# Patient Record
Sex: Male | Born: 1940 | Race: White | Hispanic: No | Marital: Married | Smoking: Former smoker
Health system: Southern US, Community
[De-identification: ages and names within clinical notes are randomized; demographics above are authoritative.]

## PROBLEM LIST (undated history)

## (undated) DIAGNOSIS — I2510 Atherosclerotic heart disease of native coronary artery without angina pectoris: Secondary | ICD-10-CM

## (undated) DIAGNOSIS — G4730 Sleep apnea, unspecified: Secondary | ICD-10-CM

## (undated) DIAGNOSIS — E785 Hyperlipidemia, unspecified: Secondary | ICD-10-CM

## (undated) DIAGNOSIS — N289 Disorder of kidney and ureter, unspecified: Secondary | ICD-10-CM

## (undated) DIAGNOSIS — E119 Type 2 diabetes mellitus without complications: Secondary | ICD-10-CM

## (undated) HISTORY — PX: NOSE SURGERY: SHX723

## (undated) HISTORY — PX: CHOLECYSTECTOMY: SHX55

## (undated) HISTORY — PX: CARDIAC SURGERY: SHX584

---

## 2001-03-15 ENCOUNTER — Ambulatory Visit (HOSPITAL_BASED_OUTPATIENT_CLINIC_OR_DEPARTMENT_OTHER): Admission: RE | Admit: 2001-03-15 | Discharge: 2001-03-15 | Payer: Self-pay | Admitting: *Deleted

## 2005-03-15 ENCOUNTER — Inpatient Hospital Stay: Payer: Self-pay

## 2005-03-15 ENCOUNTER — Other Ambulatory Visit: Payer: Self-pay

## 2006-01-02 ENCOUNTER — Encounter: Admission: RE | Admit: 2006-01-02 | Discharge: 2006-01-03 | Payer: Self-pay | Admitting: Internal Medicine

## 2006-03-28 ENCOUNTER — Encounter: Admission: RE | Admit: 2006-03-28 | Discharge: 2006-04-04 | Payer: Self-pay | Admitting: Internal Medicine

## 2007-08-23 HISTORY — PX: CORONARY ARTERY BYPASS GRAFT: SHX141

## 2008-06-25 ENCOUNTER — Encounter: Payer: Self-pay | Admitting: Internal Medicine

## 2008-07-22 ENCOUNTER — Encounter: Payer: Self-pay | Admitting: Internal Medicine

## 2008-08-22 ENCOUNTER — Encounter: Payer: Self-pay | Admitting: Internal Medicine

## 2011-04-13 ENCOUNTER — Encounter (HOSPITAL_COMMUNITY): Payer: Medicare Other

## 2011-04-13 ENCOUNTER — Other Ambulatory Visit: Payer: Self-pay | Admitting: Orthopedic Surgery

## 2011-04-13 ENCOUNTER — Other Ambulatory Visit (HOSPITAL_COMMUNITY): Payer: Self-pay | Admitting: Orthopedic Surgery

## 2011-04-13 ENCOUNTER — Ambulatory Visit (HOSPITAL_COMMUNITY)
Admission: RE | Admit: 2011-04-13 | Discharge: 2011-04-13 | Disposition: A | Discharge status: Home / Self Care | Payer: Medicare Other | Source: Ambulatory Visit | Attending: Orthopedic Surgery | Admitting: Orthopedic Surgery

## 2011-04-13 DIAGNOSIS — Z01818 Encounter for other preprocedural examination: Secondary | ICD-10-CM | POA: Insufficient documentation

## 2011-04-13 DIAGNOSIS — E119 Type 2 diabetes mellitus without complications: Secondary | ICD-10-CM | POA: Insufficient documentation

## 2011-04-13 DIAGNOSIS — Z951 Presence of aortocoronary bypass graft: Secondary | ICD-10-CM | POA: Insufficient documentation

## 2011-04-13 DIAGNOSIS — I517 Cardiomegaly: Secondary | ICD-10-CM | POA: Insufficient documentation

## 2011-04-13 DIAGNOSIS — Z01812 Encounter for preprocedural laboratory examination: Secondary | ICD-10-CM | POA: Insufficient documentation

## 2011-04-13 DIAGNOSIS — I700 Atherosclerosis of aorta: Secondary | ICD-10-CM | POA: Insufficient documentation

## 2011-04-13 LAB — COMPREHENSIVE METABOLIC PANEL
ALT: 26 U/L (ref 0–53)
Alkaline Phosphatase: 32 U/L — ABNORMAL LOW (ref 39–117)
BUN: 31 mg/dL — ABNORMAL HIGH (ref 6–23)
CO2: 28 mEq/L (ref 19–32)
Chloride: 100 mEq/L (ref 96–112)
GFR calc Af Amer: 34 mL/min — ABNORMAL LOW (ref >60)
GFR calc non Af Amer: 28 mL/min — ABNORMAL LOW (ref >60)
Glucose, Bld: 80 mg/dL (ref 70–99)
Potassium: 4.7 mEq/L (ref 3.5–5.1)
Sodium: 137 mEq/L (ref 135–145)
Total Bilirubin: 0.9 mg/dL (ref 0.3–1.2)
Total Protein: 8.1 g/dL (ref 6.0–8.3)

## 2011-04-13 LAB — CBC
HCT: 41.8 % (ref 39.0–52.0)
MCHC: 33 g/dL (ref 30.0–36.0)
MCV: 97 fL (ref 78.0–100.0)
Platelets: 168 10*3/uL (ref 150–400)
RDW: 13.2 % (ref 11.5–15.5)
WBC: 5.6 10*3/uL (ref 4.0–10.5)

## 2011-04-13 LAB — URINALYSIS, ROUTINE W REFLEX MICROSCOPIC
Bilirubin Urine: NEGATIVE
Hgb urine dipstick: NEGATIVE
Ketones, ur: NEGATIVE mg/dL
Nitrite: NEGATIVE
Protein, ur: NEGATIVE mg/dL
Specific Gravity, Urine: 1.012 (ref 1.005–1.030)
Urobilinogen, UA: 0.2 mg/dL (ref 0.0–1.0)

## 2011-04-13 LAB — SURGICAL PCR SCREEN: Staphylococcus aureus: NEGATIVE

## 2011-04-13 LAB — DIFFERENTIAL
Eosinophils Absolute: 0.2 10*3/uL (ref 0.0–0.7)
Eosinophils Relative: 3 % (ref 0–5)
Lymphs Abs: 1.6 10*3/uL (ref 0.7–4.0)
Monocytes Absolute: 0.9 10*3/uL (ref 0.1–1.0)

## 2011-04-13 LAB — PROTIME-INR: INR: 1.05 (ref 0.00–1.49)

## 2011-04-21 ENCOUNTER — Inpatient Hospital Stay (HOSPITAL_COMMUNITY)
Admission: RE | Admit: 2011-04-21 | Discharge: 2011-04-25 | DRG: 470 | Disposition: A | Discharge status: Home Health | Payer: Medicare Other | Source: Ambulatory Visit | Attending: Orthopedic Surgery | Admitting: Orthopedic Surgery

## 2011-04-21 DIAGNOSIS — Z01812 Encounter for preprocedural laboratory examination: Secondary | ICD-10-CM

## 2011-04-21 DIAGNOSIS — Z6841 Body Mass Index (BMI) 40.0 and over, adult: Secondary | ICD-10-CM

## 2011-04-21 DIAGNOSIS — G4730 Sleep apnea, unspecified: Secondary | ICD-10-CM | POA: Diagnosis present

## 2011-04-21 DIAGNOSIS — M1710 Unilateral primary osteoarthritis, unspecified knee: Principal | ICD-10-CM | POA: Diagnosis present

## 2011-04-21 DIAGNOSIS — E662 Morbid (severe) obesity with alveolar hypoventilation: Secondary | ICD-10-CM | POA: Diagnosis present

## 2011-04-21 DIAGNOSIS — I2510 Atherosclerotic heart disease of native coronary artery without angina pectoris: Secondary | ICD-10-CM | POA: Diagnosis present

## 2011-04-21 DIAGNOSIS — D62 Acute posthemorrhagic anemia: Secondary | ICD-10-CM | POA: Diagnosis not present

## 2011-04-21 DIAGNOSIS — N184 Chronic kidney disease, stage 4 (severe): Secondary | ICD-10-CM | POA: Diagnosis present

## 2011-04-21 DIAGNOSIS — I129 Hypertensive chronic kidney disease with stage 1 through stage 4 chronic kidney disease, or unspecified chronic kidney disease: Secondary | ICD-10-CM | POA: Diagnosis present

## 2011-04-21 DIAGNOSIS — E119 Type 2 diabetes mellitus without complications: Secondary | ICD-10-CM | POA: Diagnosis present

## 2011-04-21 DIAGNOSIS — M21169 Varus deformity, not elsewhere classified, unspecified knee: Secondary | ICD-10-CM | POA: Diagnosis present

## 2011-04-21 DIAGNOSIS — Z951 Presence of aortocoronary bypass graft: Secondary | ICD-10-CM

## 2011-04-21 LAB — GLUCOSE, CAPILLARY
Glucose-Capillary: 139 mg/dL — ABNORMAL HIGH (ref 70–99)
Glucose-Capillary: 114 mg/dL — ABNORMAL HIGH (ref 70–99)
Glucose-Capillary: 112 mg/dL — ABNORMAL HIGH (ref 70–99)
Glucose-Capillary: 110 mg/dL — ABNORMAL HIGH (ref 70–99)

## 2011-04-21 LAB — BASIC METABOLIC PANEL
Calcium: 11.3 mg/dL — ABNORMAL HIGH (ref 8.4–10.5)
GFR calc Af Amer: 30 mL/min — ABNORMAL LOW (ref >60)
GFR calc non Af Amer: 24 mL/min — ABNORMAL LOW (ref >60)
Potassium: 4.5 mEq/L (ref 3.5–5.1)
Sodium: 140 mEq/L (ref 135–145)

## 2011-04-21 LAB — TYPE AND SCREEN
ABO/RH(D): A NEG
Antibody Screen: NEGATIVE

## 2011-04-22 LAB — HEMOGLOBIN A1C
Hgb A1c MFr Bld: 6.3 % — ABNORMAL HIGH (ref <5.7)
Mean Plasma Glucose: 134 mg/dL — ABNORMAL HIGH (ref <117)

## 2011-04-22 LAB — BASIC METABOLIC PANEL
BUN: 30 mg/dL — ABNORMAL HIGH (ref 6–23)
GFR calc Af Amer: 34 mL/min — ABNORMAL LOW (ref >60)
GFR calc non Af Amer: 28 mL/min — ABNORMAL LOW (ref >60)
Potassium: 4.4 mEq/L (ref 3.5–5.1)
Sodium: 136 mEq/L (ref 135–145)

## 2011-04-22 LAB — CBC
MCHC: 33.2 g/dL (ref 30.0–36.0)
RDW: 13.3 % (ref 11.5–15.5)

## 2011-04-22 NOTE — Op Note (Signed)
Jonathan Roman, Jonathan Roman                ACCOUNT NO.:  [id number]  MEDICAL RECORD NO.:  [id number]  LOCATION:  [id number]                         FACILITY:  [facility]  PHYSICIAN:  John L. Rendall, M.D.  DATE OF BIRTH:  Apr 06, 1941  DATE OF PROCEDURE:  04/21/2011 DATE OF DISCHARGE:                              OPERATIVE REPORT   PREOPERATIVE DIAGNOSIS:  End-stage osteoarthritis right knee with deformity.  SURGICAL PROCEDURE:  Right LCS total knee arthroplasty with computer navigation assistance.  POSTOPERATIVE DIAGNOSIS:  End-stage osteoarthritis right knee with deformity.  SURGEON:  John L. Rendall, MD  ASSISTANT:  Vonita Moss. Duffy, PA-C  PATHOLOGY:  The patient has longstanding knee arthritis that has been quite bothersome for years.  He has pain with weightbearing pain, getting up and down, it has reached to the point where it has greatly diminished his quality of living.  Intermittent use of cane is required.  Pathology at surgery was fixed 10 degrees varus deformity with 7-degree flexion contracture, total bone against bone medial compartment of the knee.  Some areas of bare bone lateral compartment and under the patella with spurs in all compartments.  PROCEDURE IN DETAIL:  Under general anesthesia with femoral nerve block, the right leg was prepared with DuraPrep and draped as sterile field.  A sterile tourniquet was used on the proximal thigh.  Leg was wrapped out with the Esmarch and the tourniquet was used at 350 mm.  Midline incision was made.  The patella was everted and the debridement was done in preparation for computer mapping.  Schanz pins were then placed in the superior medial tibia and distal femur.  The arrays were set up, the femoral head was found, medial and lateral malleoli were identified. Mapping of the proximal tibia and distal femur were then done.  The femoral component size was estimated to be a large.  Using the first computer guide, the superior tibia was  resected.  Using the balancing clamps, the knee was then extended and brought to within 1 degree of anatomic alignment from 10 degrees.  This required takedown of medial tibial spurs and release of the deep medial collateral ligament, leaving the superficial intact distally.  Once this was done, the alignment was obtainable within 1 degree of anatomic.  Then using the first femoral guide, the anterior and posterior flare of the distal femur were resected.  It should be noted that there was some slight rotation to the femur that made a bony prominence of the lateral side of the femur, it was not true notching and this was smoothed.  The posterior femoral condyles were then trimmed, rotation was within 1 degree of anatomic. The distal femoral cut was then made, flexion and extension gaps were equal at approximately 12.5 mm.  The lamina spreader was inserted. Remnants of the cruciates and menisci were resected as well as spurs off the back of the femoral condyles.  Once this was debrided, the recessing guide was used.  Then, attention was turned to the tibia because of the gentleman's size, an M.B.T. revision tray was used as it would be expected to hold up longer to his weight as it is an inch longer.  The M.B.T. revision tray was then trialed with a 12.5 bearing and large femur, alignment was within 1 degree in longitudinal alignment and flexion allowed 110 degrees easily.  There was good stability to varus valgus and drawer.  At this point, permanent components were obtained. The LCS large femur, M.B.T. revision tibial tray, 12.5 bearing, and large patellar component.  While the cement mixed, bony surfaces were prepared with pulse irrigation.  While cement hardened and all components were in place, synovectomy was completed, tourniquet was let down at an hour and 10 minutes.  Multiple small vessels were cauterized. The knee was then closed in layers with #1 Tycron, #1 Vicryl, 2-0 Vicryl,  and skin clips.  A medium Hemovac drain was in the knee.  The patient tolerated the procedure well and returned to Recovery in good condition.     John L. Telford Nab, M.D.     Judie Grieve  D:  04/21/2011  T:  04/22/2011  Job:  [id number]  Electronically Signed by Oretha Caprice M.D. on 04/22/71 [id number] AM

## 2011-04-23 LAB — CBC
HCT: 30.1 % — ABNORMAL LOW (ref 39.0–52.0)
Hemoglobin: 10 g/dL — ABNORMAL LOW (ref 13.0–17.0)
MCV: 96.8 fL (ref 78.0–100.0)
RDW: 13.2 % (ref 11.5–15.5)
WBC: 9.6 10*3/uL (ref 4.0–10.5)

## 2011-04-23 LAB — BASIC METABOLIC PANEL
BUN: 27 mg/dL — ABNORMAL HIGH (ref 6–23)
Chloride: 101 mEq/L (ref 96–112)
Creatinine, Ser: 2.17 mg/dL — ABNORMAL HIGH (ref 0.50–1.35)
GFR calc Af Amer: 36 mL/min — ABNORMAL LOW (ref >60)
Glucose, Bld: 144 mg/dL — ABNORMAL HIGH (ref 70–99)

## 2011-04-23 LAB — GLUCOSE, CAPILLARY: Glucose-Capillary: 127 mg/dL — ABNORMAL HIGH (ref 70–99)

## 2011-04-24 LAB — CBC
HCT: 27.3 % — ABNORMAL LOW (ref 39.0–52.0)
Hemoglobin: 9.2 g/dL — ABNORMAL LOW (ref 13.0–17.0)
MCHC: 33.7 g/dL (ref 30.0–36.0)
RBC: 2.83 MIL/uL — ABNORMAL LOW (ref 4.22–5.81)
WBC: 10.4 10*3/uL (ref 4.0–10.5)

## 2011-04-24 LAB — GLUCOSE, CAPILLARY
Glucose-Capillary: 187 mg/dL — ABNORMAL HIGH (ref 70–99)
Glucose-Capillary: 141 mg/dL — ABNORMAL HIGH (ref 70–99)
Glucose-Capillary: 145 mg/dL — ABNORMAL HIGH (ref 70–99)

## 2011-04-25 LAB — GLUCOSE, CAPILLARY
Glucose-Capillary: 121 mg/dL — ABNORMAL HIGH (ref 70–99)
Glucose-Capillary: 119 mg/dL — ABNORMAL HIGH (ref 70–99)

## 2011-04-27 LAB — GLUCOSE, CAPILLARY: Glucose-Capillary: 149 mg/dL — ABNORMAL HIGH (ref 70–99)

## 2011-05-25 NOTE — Discharge Summary (Addendum)
Jonathan Roman, Jonathan Roman                ACCOUNT NO.:  [id number]  MEDICAL RECORD NO.:  [id number]  LOCATION:  [id number]                         FACILITY:  [facility]  PHYSICIAN:  Ila Landowski L. Rendall, M.D.  DATE OF BIRTH:  1940-11-29  DATE OF ADMISSION:  04/21/2011 DATE OF DISCHARGE:  04/25/2011                              DISCHARGE SUMMARY   ADMISSION DIAGNOSES: 1. End-stage osteoarthritis, right knee. 2. Morbid obesity. 3. Type 2 diabetes mellitus. 4. 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 5. Sleep apnea/obesity, hypoventilation syndrome. 6. Chronic renal insufficiency.  DISCHARGE DIAGNOSES: 1. End-stage osteoarthritis, right knee, status post right total knee     arthroplasty. 2. Acute blood loss anemia secondary to surgery. 3. Morbid obesity. 4. Type 2 diabetes mellitus. 5. 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 6. Sleep apnea/obesity, hypoventilation syndrome. 7. Chronic renal insufficiency/stage 3 or 4 kidney disease.  SURGICAL PROCEDURE:  On April 21, 2011, Jonathan Roman underwent a right total knee arthroplasty with computer navigation by Dr. Jenny Reichmann L. Rendall, assisted by Zenaida Deed, PA-C.  He had an LCS complete metal backed patella cemented, size large, placed with a primary femoral cemented component, large right.  A tibial tray rotating platform MBT revision size 5 cemented with an LCS complete rotating platform 12.5-mm thickness.  COMPLICATIONS:  None.  CONSULTS: 1. Respiratory therapy, consult April 21, 2011. 2. Physical therapy consult, April 22, 2011. 3. Occupational therapy and case management consult, April 23, 2011.  HISTORY OF PRESENT ILLNESS:  This 70 year old white male patient presented to Dr. Telford Nab with a 7-to-10-year history of sudden onset of progressive right knee pain without injury or prior surgery.  At this point, the right knee pain is constant, throbbing, over the anterior knee without radiation.  It increases with standing and  decreases with rest.  Percocet provides some relief, but the knee pops and swells.  He has had cortisone injections and anti-inflammatories and viscosupplementation with minimal relief.  He has a significant limp and does not walk with any assisted devices.  He has failed conservative treatment.  Because of that, he is presenting for a right knee replacement.  HOSPITAL COURSE:  Jonathan Roman tolerated his surgical procedure well without immediate postoperative complications.  He is transferred to the orthopedic floor.  Postop day #1, T-max was 99.2.  Vitals were stable. Hemoglobin 10.5, hematocrit 31.6.  He was switched from the PCA to the Percocet for pain, weaned off his oxygen and started on therapy per protocol.  He made good progress over the next several days.  It was slow at times. His hemoglobin dropped to 9.2 and 27.3 on April 24, 2011.  He had some constipation, which was treated effectively with laxative.  It was felt on April 25, 2011, he remained afebrile.  The vital signs stable.  He was doing well enough with therapy.  That facility was ready for discharge home and he was discharged home later that day.  DISCHARGE INSTRUCTIONS:  DIET:  He can resume his regular prehospitalization diet.  MEDICATIONS:  Please see the patient med discharge instruction sheet for complete documentation of medications, but we did place him on 3 new meds Robaxin,  Percocet and Xarelto.  We had him stopped his aspirin at this time until he has completed his Xarelto and stopped hydrocodone. The rest the medications are on the med discharge instruction sheet.  ACTIVITY:  He can be out of bed weightbearing as tolerated on the right leg with use of a walker.  No lifting or driving for 6 weeks.  Please see the right total joint discharge sheet for further activity instructions.  WOUND CARE:  Please see the right total joint discharge sheet for further wound care instructions.  FOLLOWUP:  He  is to follow up with Dr. Telford Nab in our office on Tuesday, May 03, 2011, and needs to call [phone] for that appointment.  He is arranged for home health per [facility].  LABORATORY DATA:  He did have a tick removed from his leg day after surgery.  It was a lone star tick.  Hemoglobin/hematocrit ranged from 10.5 and 31.6 from April 22, 2011, to 9.2 and 27.3 on the April 24, 2011.  Platelets and white count were within normal limits.  Glucose ranged from 137 on the April 21, 2011, to 147 on the April 22, 2011.  BUN and creatinine went from 41 and 2.6 on the August 30, to 27 and 2.17 on April 23, 2011.  Estimated GFR was 24 to 30, on the April 21, 2011, and on April 23, 2011.  Calcium went from 11.3 on the April 21, 2011, to 9.5 on the April 23, 2011.  His hemoglobin A1c on April 22, 2011, was 6.3%. All other laboratory studies were within normal limits.    Vonita Moss Duffy, P.A.   ______________________________ Karen Chafe. Telford Nab, M.D.   KED/MEDQ  D:  05/12/2011  T:  05/12/2011  Job:  [id number]  Electronically Signed by Jenne Campus. on 05/19/2011 09:39:06 AM Electronically Signed by Oretha Caprice M.D. on 05/25/2011 02:03:41 PM Electronically Signed by Oretha Caprice M.D. on 05/25/2011 02:03:39 PM

## 2016-03-13 ENCOUNTER — Emergency Department (HOSPITAL_COMMUNITY)
Admission: EM | Admit: 2016-03-13 | Discharge: 2016-03-13 | Disposition: A | Discharge status: Home / Self Care | Payer: Medicare Other | Attending: Emergency Medicine | Admitting: Emergency Medicine

## 2016-03-13 ENCOUNTER — Encounter (HOSPITAL_COMMUNITY): Payer: Self-pay | Admitting: Emergency Medicine

## 2016-03-13 DIAGNOSIS — Z79899 Other long term (current) drug therapy: Secondary | ICD-10-CM | POA: Diagnosis not present

## 2016-03-13 DIAGNOSIS — Z794 Long term (current) use of insulin: Secondary | ICD-10-CM | POA: Insufficient documentation

## 2016-03-13 DIAGNOSIS — M10062 Idiopathic gout, left knee: Secondary | ICD-10-CM | POA: Insufficient documentation

## 2016-03-13 DIAGNOSIS — M10072 Idiopathic gout, left ankle and foot: Secondary | ICD-10-CM | POA: Diagnosis not present

## 2016-03-13 DIAGNOSIS — M109 Gout, unspecified: Secondary | ICD-10-CM

## 2016-03-13 DIAGNOSIS — E119 Type 2 diabetes mellitus without complications: Secondary | ICD-10-CM | POA: Diagnosis not present

## 2016-03-13 DIAGNOSIS — Z7982 Long term (current) use of aspirin: Secondary | ICD-10-CM | POA: Diagnosis not present

## 2016-03-13 DIAGNOSIS — M25562 Pain in left knee: Secondary | ICD-10-CM | POA: Diagnosis present

## 2016-03-13 HISTORY — DX: Type 2 diabetes mellitus without complications: E11.9

## 2016-03-13 MED ORDER — OXYCODONE-ACETAMINOPHEN 5-325 MG PO TABS
2.0000 | ORAL_TABLET | Freq: Once | ORAL | Status: AC
  Administered 2016-03-13: 2 via ORAL
  Filled 2016-03-13: qty 2

## 2016-03-13 MED ORDER — COLCHICINE 0.6 MG PO TABS
1.2000 mg | ORAL_TABLET | Freq: Once | ORAL | Status: AC
  Administered 2016-03-13: 1.2 mg via ORAL
  Filled 2016-03-13: qty 2

## 2016-03-13 MED ORDER — OXYCODONE-ACETAMINOPHEN 5-325 MG PO TABS
1.0000 | ORAL_TABLET | ORAL | 0 refills | Status: AC | PRN
Start: 2016-03-13 — End: ?

## 2016-03-13 MED ORDER — IBUPROFEN 200 MG PO TABS
600.0000 mg | ORAL_TABLET | Freq: Once | ORAL | Status: AC
  Administered 2016-03-13: 600 mg via ORAL
  Filled 2016-03-13: qty 3

## 2016-03-13 MED ORDER — COLCHICINE 0.6 MG PO TABS: 0.6000 mg | ORAL_TABLET | Freq: Two times a day (BID) | ORAL | 0 refills | Status: AC

## 2016-03-13 NOTE — ED Triage Notes (Addendum)
P/t comes in with c/o of pain 9/10 and inability to walk due to L. Knee and Foot pain. Per p/t this is the second occurrence last occurring 1.5 years ago with gout diagnosis.

## 2016-03-13 NOTE — ED Provider Notes (Signed)
[place] DEPT Provider Note   CSN: [id number] Arrival date & time: 03/13/16  [id number]  First Provider Contact:  None       History   Chief Complaint Chief Complaint  Patient presents with  . Knee Pain    Left  . Foot Pain    Left    HPI Jonathan Roman is a 75 y.o. male. Patient presents to the emergency department with complaints of increasing left ankle and left knee pain over the past several days.  He has a history of gout and reports this feels similar.  His pain is severe in severity and worse with range of motion of his left knee and left ankle.  No fevers or chills at home.  Denies nausea vomiting or diarrhea.  He had some leftover oxycodone which he took which gave him some mild improvement.      Past Medical History:  Diagnosis Date  . Diabetes mellitus without complication ([place])     There are no active problems to display for this patient.   Past Surgical History:  Procedure Laterality Date  . CARDIAC SURGERY         Home Medications    Prior to Admission medications   Medication Sig Start Date End Date Taking? Authorizing Provider  allopurinol (ZYLOPRIM) 100 MG tablet Take 100 mg by mouth daily.   Yes Historical Provider, MD  amoxicillin (AMOXIL) 500 MG capsule Take 500 mg by mouth 2 (two) times daily. 03/08/16  Yes Historical Provider, MD  aspirin EC 81 MG tablet Take 81 mg by mouth daily.   Yes Historical Provider, MD  Cholecalciferol (VITAMIN D-3) 1000 units CAPS Take 1,000 Units by mouth daily.   Yes Historical Provider, MD  fenofibrate (TRICOR) 48 MG tablet Take 48 mg by mouth daily.   Yes Historical Provider, MD  insulin NPH Human (HUMULIN N,NOVOLIN N) 100 UNIT/ML injection Inject 20 Units into the skin at bedtime.   Yes Historical Provider, MD  insulin regular (NOVOLIN R,HUMULIN R) 100 units/mL injection Inject 14 Units into the skin 3 (three) times daily before meals.   Yes Historical Provider, MD  lisinopril (PRINIVIL,ZESTRIL) 20 MG tablet  Take 20 mg by mouth daily.   Yes Historical Provider, MD  metoprolol tartrate (LOPRESSOR) 25 MG tablet Take 12.5 mg by mouth 2 (two) times daily.   Yes Historical Provider, MD  niacin 500 MG tablet Take 500 mg by mouth 2 (two) times daily.   Yes Historical Provider, MD  nortriptyline (PAMELOR) 10 MG capsule Take 10 mg by mouth at bedtime.   Yes Historical Provider, MD  Omega-3 Fatty Acids (FISH OIL) 1000 MG CPDR Take 1,000 mg by mouth daily.   Yes Historical Provider, MD  simvastatin (ZOCOR) 40 MG tablet Take 40 mg by mouth daily.   Yes Historical Provider, MD    Family History No family history on file.  Social History Social History  Substance Use Topics  . Smoking status: Former Smoker    Packs/day: 1.00    Types: Cigarettes    Quit date: 30  . Smokeless tobacco: Never Used  . Alcohol use No     Allergies   Statins   Review of Systems Review of Systems  All other systems reviewed and are negative.    Physical Exam Updated Vital Signs BP 123/73 (BP Location: Right Arm)   Pulse 96   Temp 98.3 F (36.8 C) (Oral)   Resp 18   SpO2 96%   Physical Exam  Constitutional:  He is oriented to person, place, and time. He appears well-developed and well-nourished.  HENT:  Head: Normocephalic.  Eyes: EOM are normal.  Neck: Normal range of motion.  Pulmonary/Chest: Effort normal.  Abdominal: He exhibits no distension.  Musculoskeletal:  Warmth and swelling of both the left knee and left ankle.  Both are painful to range of motion.  Normal pulses in left foot.  No swelling of the left lower extremity as compared to right grossly.  Neurological: He is alert and oriented to person, place, and time.  Psychiatric: He has a normal mood and affect.  Nursing note and vitals reviewed.    ED Treatments / Results  Labs (all labs ordered are listed, but only abnormal results are displayed) Labs Reviewed - No data to display  EKG  EKG Interpretation None        Radiology No results found.  Procedures Procedures (including critical care time)  Medications Ordered in ED Medications  oxyCODONE-acetaminophen (PERCOCET/ROXICET) 5-325 MG per tablet 2 tablet (2 tablets Oral Given 03/13/16 1119)  ibuprofen (ADVIL,MOTRIN) tablet 600 mg (600 mg Oral Given 03/13/16 1121)  colchicine tablet 1.2 mg (1.2 mg Oral Given 03/13/16 1122)     Initial Impression / Assessment and Plan / ED Course  I have reviewed the triage vital signs and the nursing notes.  Pertinent labs & imaging results that were available during my care of the patient were reviewed by me and considered in my medical decision making (see chart for details).  Clinical Course  Comment By Time  Will tx pain [name], MD 07/23 1042    1:36 PM Patient feels better this time.  His examination and history are consistent with acute gouty flare of both his left knee and left ankle.  No injury or trauma.  Pain treated.  Patient be discharged home in good condition with primary care follow-up.  I will discharge the patient home with Percocet and colchicine.  He understands to return to the ER for new or worsening symptoms  Final Clinical Impressions(s) / ED Diagnoses   Final diagnoses:  Acute gout of left ankle, unspecified cause  Acute gout of left knee, unspecified cause    New Prescriptions New Prescriptions   COLCHICINE 0.6 MG TABLET    Take 1 tablet (0.6 mg total) by mouth 2 (two) times daily.   OXYCODONE-ACETAMINOPHEN (PERCOCET/ROXICET) 5-325 MG TABLET    Take 1 tablet by mouth every 4 (four) hours as needed for severe pain.     [name], MD 03/13/16 [phone]

## 2017-02-04 ENCOUNTER — Encounter: Payer: Self-pay | Admitting: Emergency Medicine

## 2017-02-04 ENCOUNTER — Emergency Department
Admission: EM | Admit: 2017-02-04 | Discharge: 2017-02-04 | Disposition: A | Discharge status: Home / Self Care | Payer: Medicare Other | Attending: Emergency Medicine | Admitting: Emergency Medicine

## 2017-02-04 ENCOUNTER — Emergency Department: Payer: Medicare Other

## 2017-02-04 DIAGNOSIS — Y999 Unspecified external cause status: Secondary | ICD-10-CM | POA: Insufficient documentation

## 2017-02-04 DIAGNOSIS — Z794 Long term (current) use of insulin: Secondary | ICD-10-CM | POA: Insufficient documentation

## 2017-02-04 DIAGNOSIS — Z79899 Other long term (current) drug therapy: Secondary | ICD-10-CM | POA: Insufficient documentation

## 2017-02-04 DIAGNOSIS — Y929 Unspecified place or not applicable: Secondary | ICD-10-CM | POA: Diagnosis not present

## 2017-02-04 DIAGNOSIS — I2510 Atherosclerotic heart disease of native coronary artery without angina pectoris: Secondary | ICD-10-CM | POA: Insufficient documentation

## 2017-02-04 DIAGNOSIS — S61412A Laceration without foreign body of left hand, initial encounter: Secondary | ICD-10-CM | POA: Insufficient documentation

## 2017-02-04 DIAGNOSIS — S60922A Unspecified superficial injury of left hand, initial encounter: Secondary | ICD-10-CM | POA: Diagnosis present

## 2017-02-04 DIAGNOSIS — Z23 Encounter for immunization: Secondary | ICD-10-CM | POA: Diagnosis not present

## 2017-02-04 DIAGNOSIS — E119 Type 2 diabetes mellitus without complications: Secondary | ICD-10-CM | POA: Diagnosis not present

## 2017-02-04 DIAGNOSIS — Y939 Activity, unspecified: Secondary | ICD-10-CM | POA: Diagnosis not present

## 2017-02-04 DIAGNOSIS — Z87891 Personal history of nicotine dependence: Secondary | ICD-10-CM | POA: Diagnosis not present

## 2017-02-04 DIAGNOSIS — W293XXA Contact with powered garden and outdoor hand tools and machinery, initial encounter: Secondary | ICD-10-CM | POA: Insufficient documentation

## 2017-02-04 DIAGNOSIS — Z7982 Long term (current) use of aspirin: Secondary | ICD-10-CM | POA: Diagnosis not present

## 2017-02-04 HISTORY — DX: Hyperlipidemia, unspecified: E78.5

## 2017-02-04 HISTORY — DX: Disorder of kidney and ureter, unspecified: N28.9

## 2017-02-04 HISTORY — DX: Atherosclerotic heart disease of native coronary artery without angina pectoris: I25.10

## 2017-02-04 MED ORDER — TETANUS-DIPHTH-ACELL PERTUSSIS 5-2.5-18.5 LF-MCG/0.5 IM SUSP
0.5000 mL | Freq: Once | INTRAMUSCULAR | Status: AC
  Administered 2017-02-04: 0.5 mL via INTRAMUSCULAR
  Filled 2017-02-04: qty 0.5

## 2017-02-04 MED ORDER — LIDOCAINE-EPINEPHRINE 2 %-1:100000 IJ SOLN
20.0000 mL | Freq: Once | INTRAMUSCULAR | Status: AC
  Administered 2017-02-04: 20 mL via INTRADERMAL
  Filled 2017-02-04: qty 20

## 2017-02-04 MED ORDER — CEPHALEXIN 500 MG PO CAPS: 500.0000 mg | ORAL_CAPSULE | Freq: Two times a day (BID) | ORAL | 0 refills | Status: AC

## 2017-02-04 NOTE — ED Triage Notes (Signed)
Pt to ED via POV for laceration to the left hand. Pt states that he cut himself with a chainsaw approximately 45 minutes PTA. Bleeding is controlled at this time.

## 2017-02-04 NOTE — ED Notes (Signed)
Laceration noted to outside of left hand. Patient reports he cut it with a chainsaw this morning. Hand wrapped in gauze with small amount of bleeding noted. Pressure dressing applied. Patient reports taking daily baby aspirin. Denies use of other blood thinners.

## 2017-02-04 NOTE — ED Provider Notes (Signed)
[facility] Emergency Department Provider Note  ____________________________________________  Time seen: Approximately 12:30 PM  I have reviewed the triage vital signs and the nursing notes.   HISTORY  Chief Complaint Extremity Laceration   HPI Jonathan Roman is a 76 y.o. male who presents to the emergency department for evaluation after sustaining a laceration to his left hand.He cut himself with a chainsaw approximately 45 minutes prior to arrival. Bleeding is well-controlled. He denies loss of sensation or inability to move his hand or fingers. He is unsure when he last had a tetanus booster.  Past Medical History:  Diagnosis Date  . Coronary artery disease   . Diabetes mellitus without complication ([place])   . Hyperlipemia   . Renal disorder     There are no active problems to display for this patient.   Past Surgical History:  Procedure Laterality Date  . CARDIAC SURGERY      Prior to Admission medications   Medication Sig Start Date End Date Taking? Authorizing Provider  allopurinol (ZYLOPRIM) 100 MG tablet Take 100 mg by mouth daily.    [provider]  amoxicillin (AMOXIL) 500 MG capsule Take 500 mg by mouth 2 (two) times daily. 03/08/16   [provider]  aspirin EC 81 MG tablet Take 81 mg by mouth daily.    [provider]  cephALEXin (KEFLEX) 500 MG capsule Take 1 capsule (500 mg total) by mouth 2 (two) times daily. 02/04/17 02/14/17  Amberleigh Gerken, Johnette Abraham B, FNP  Cholecalciferol (VITAMIN D-3) 1000 units CAPS Take 1,000 Units by mouth daily.    [provider]  colchicine 0.6 MG tablet Take 1 tablet (0.6 mg total) by mouth 2 (two) times daily. 03/13/16   [name], MD  fenofibrate (TRICOR) 48 MG tablet Take 48 mg by mouth daily.    [provider]  insulin NPH Human (HUMULIN N,NOVOLIN N) 100 UNIT/ML injection Inject 20 Units into the skin at bedtime.    [provider]  insulin regular (NOVOLIN  R,HUMULIN R) 100 units/mL injection Inject 14 Units into the skin 3 (three) times daily before meals.    [provider]  lisinopril (PRINIVIL,ZESTRIL) 20 MG tablet Take 20 mg by mouth daily.    [provider]  metoprolol tartrate (LOPRESSOR) 25 MG tablet Take 12.5 mg by mouth 2 (two) times daily.    [provider]  niacin 500 MG tablet Take 500 mg by mouth 2 (two) times daily.    [provider]  nortriptyline (PAMELOR) 10 MG capsule Take 10 mg by mouth at bedtime.    [provider]  Omega-3 Fatty Acids (FISH OIL) 1000 MG CPDR Take 1,000 mg by mouth daily.    [provider]  oxyCODONE-acetaminophen (PERCOCET/ROXICET) 5-325 MG tablet Take 1 tablet by mouth every 4 (four) hours as needed for severe pain. 03/13/16   [name], MD  simvastatin (ZOCOR) 40 MG tablet Take 40 mg by mouth daily.    [provider]    Allergies Statins  No family history on file.  Social History Social History  Substance Use Topics  . Smoking status: Former Smoker    Packs/day: 1.00    Types: Cigarettes    Quit date: 51  . Smokeless tobacco: Never Used  . Alcohol use No    Review of Systems  Constitutional: Well appearing.  Respiratory: Negative for cough or shortness of breath  Musculoskeletal: Negative for decreased range of motion or strength of the left hand  Skin: Positive for laceration of the left hand Neurological: Negative for decrease in sensation of the left hand. ____________________________________________   PHYSICAL EXAM:  VITAL SIGNS: ED Triage Vitals  Enc Vitals Group     BP 02/04/17 1200 139/69     Pulse Rate 02/04/17 1200 81     Resp 02/04/17 1200 16     Temp 02/04/17 1200 99.1 F (37.3 C)     Temp Source 02/04/17 1200 Oral     SpO2 02/04/17 1200 94 %     Weight --      Height --      Head Circumference --      Peak Flow --      Pain Score 02/04/17 1146 0     Pain Loc --      Pain Edu? --      Excl.  in [place]? --      Constitutional: Well appearing. Mouth/Throat: Airway is patent Neck: Active, full range of motion.  Cardiovascular: Bleeding well controlled. Radial pulse on the left 2+ Respiratory: Respirations even and unlabored. Musculoskeletal: Active, full range of motion specifically of the left hand and fingers Neurologic: Normal sensation over the left hand, specifically the small finger Skin:  2 lacerations noted over the lateral aspect of the left hand at the MCP of the small finger and in the skin fold at the base of the small finger and palm  ____________________________________________   LABS (all labs ordered are listed, but only abnormal results are displayed)  Labs Reviewed - No data to display ____________________________________________  EKG  Not indicated ____________________________________________  RADIOLOGY  Left hand imaging does not show any fracture, and this location, or foreign body related to this injury. ____________________________________________   PROCEDURES  Procedure(s) performed:  LACERATION REPAIR Performed by: Sherrie George  Consent: Verbal consent obtained.  Consent given by: patient  Prepped and Draped in normal sterile fashion  Wound explored: No foreign bodies identified/removed  Laceration Location: left hand--lateral aspect at the MCP of the small finger and in the skin fold of the small finger and palmar surface.  Laceration Length: 5cm/ 4cm  Anesthesia: local  Local anesthetic: lidocaine 2% with epinephrine  Anesthetic total: 4 ml  Irrigation method: syringe  Amount of cleaning: Standard  Skin closure: 5-0 vicryl and 4-0 Nylon  Number of sutures: Running with 4 throws in 5cm laceration; total external sutures 9  Technique: simple interrupted  Patient tolerance: Patient tolerated the procedure well with no immediate complications.    ____________________________________________   INITIAL IMPRESSION /  ASSESSMENT AND PLAN / ED COURSE  Jonathan Roman is a 76 y.o. male who presents to the emergency department for evaluation and treatment after sustaining a laceration to his left hand while using his chain saw. Lacerations were repaired and bleeding was well controlled. Patient tolerated the procedure well. Tetanus shot was updated today. He will be instructed to follow-up with his primary care provider in 10-12 days to have the sutures removed. He was also placed on prescription for Keflex for prophylactic antibiotic due to potential infection secondary to mechanism of injury and patient is diabetic. He is instructed to see his primary care provider sooner if he is concerned that all of infection. He was also advised that he may return to the emergency department for any symptom of concern if he is unable to schedule an appointment. Wound care was also discussed and given in written format.  Pertinent labs & imaging results that were available during my  care of the patient were reviewed by me and considered in my medical decision making (see chart for details). ____________________________________________   FINAL CLINICAL IMPRESSION(S) / ED DIAGNOSES  Final diagnoses:  Laceration of left hand without foreign body, initial encounter    New Prescriptions   CEPHALEXIN (KEFLEX) 500 MG CAPSULE    Take 1 capsule (500 mg total) by mouth 2 (two) times daily.    If controlled substance prescribed during this visit, 12 month history viewed on the [facility] prior to issuing an initial prescription for Schedule II or III opiod.   Note:  This document was prepared using Dragon voice recognition software and may include unintentional dictation errors.    Victorino Dike, FNP 02/04/17 1536    Delman Kitten, MD 02/04/17 [phone]

## 2017-02-04 NOTE — Discharge Instructions (Signed)
Have your primary care doctor remove the sutures in 10-12 days. Do not get the sutured area wet for 24 hours. After 24 hours, shower/bathe as usual and pat the area dry. Change the bandage 2 times per day and apply antibiotic ointment. Leave open to air when at no risk of getting the area dirty, but cover at night before bed.

## 2019-09-16 ENCOUNTER — Encounter (HOSPITAL_COMMUNITY): Payer: Self-pay | Admitting: Internal Medicine

## 2019-09-16 ENCOUNTER — Telehealth: Payer: Self-pay | Admitting: Emergency Medicine

## 2019-09-16 ENCOUNTER — Emergency Department (HOSPITAL_COMMUNITY): Payer: No Typology Code available for payment source

## 2019-09-16 ENCOUNTER — Other Ambulatory Visit: Payer: Self-pay

## 2019-09-16 ENCOUNTER — Inpatient Hospital Stay (HOSPITAL_COMMUNITY)
Admission: EM | Admit: 2019-09-16 | Discharge: 2019-09-23 | DRG: 177 | Disposition: E | Payer: No Typology Code available for payment source | Attending: Internal Medicine | Admitting: Internal Medicine

## 2019-09-16 DIAGNOSIS — I2510 Atherosclerotic heart disease of native coronary artery without angina pectoris: Secondary | ICD-10-CM | POA: Diagnosis present

## 2019-09-16 DIAGNOSIS — Z7982 Long term (current) use of aspirin: Secondary | ICD-10-CM

## 2019-09-16 DIAGNOSIS — E1122 Type 2 diabetes mellitus with diabetic chronic kidney disease: Secondary | ICD-10-CM | POA: Diagnosis present

## 2019-09-16 DIAGNOSIS — I10 Essential (primary) hypertension: Secondary | ICD-10-CM | POA: Diagnosis present

## 2019-09-16 DIAGNOSIS — Z66 Do not resuscitate: Secondary | ICD-10-CM | POA: Diagnosis not present

## 2019-09-16 DIAGNOSIS — Z951 Presence of aortocoronary bypass graft: Secondary | ICD-10-CM | POA: Diagnosis not present

## 2019-09-16 DIAGNOSIS — T380X5A Adverse effect of glucocorticoids and synthetic analogues, initial encounter: Secondary | ICD-10-CM | POA: Diagnosis not present

## 2019-09-16 DIAGNOSIS — R0602 Shortness of breath: Secondary | ICD-10-CM

## 2019-09-16 DIAGNOSIS — M109 Gout, unspecified: Secondary | ICD-10-CM | POA: Diagnosis present

## 2019-09-16 DIAGNOSIS — Z87891 Personal history of nicotine dependence: Secondary | ICD-10-CM | POA: Diagnosis not present

## 2019-09-16 DIAGNOSIS — E1165 Type 2 diabetes mellitus with hyperglycemia: Secondary | ICD-10-CM | POA: Diagnosis not present

## 2019-09-16 DIAGNOSIS — Z6841 Body Mass Index (BMI) 40.0 and over, adult: Secondary | ICD-10-CM

## 2019-09-16 DIAGNOSIS — Z794 Long term (current) use of insulin: Secondary | ICD-10-CM

## 2019-09-16 DIAGNOSIS — Z515 Encounter for palliative care: Secondary | ICD-10-CM | POA: Diagnosis not present

## 2019-09-16 DIAGNOSIS — G9341 Metabolic encephalopathy: Secondary | ICD-10-CM | POA: Diagnosis not present

## 2019-09-16 DIAGNOSIS — E861 Hypovolemia: Secondary | ICD-10-CM | POA: Diagnosis present

## 2019-09-16 DIAGNOSIS — Z20822 Contact with and (suspected) exposure to covid-19: Secondary | ICD-10-CM | POA: Diagnosis present

## 2019-09-16 DIAGNOSIS — J1282 Pneumonia due to coronavirus disease 2019: Secondary | ICD-10-CM | POA: Diagnosis present

## 2019-09-16 DIAGNOSIS — U071 COVID-19: Secondary | ICD-10-CM | POA: Diagnosis present

## 2019-09-16 DIAGNOSIS — Z888 Allergy status to other drugs, medicaments and biological substances status: Secondary | ICD-10-CM | POA: Diagnosis not present

## 2019-09-16 DIAGNOSIS — E871 Hypo-osmolality and hyponatremia: Secondary | ICD-10-CM | POA: Diagnosis present

## 2019-09-16 DIAGNOSIS — J9601 Acute respiratory failure with hypoxia: Secondary | ICD-10-CM | POA: Diagnosis present

## 2019-09-16 DIAGNOSIS — N184 Chronic kidney disease, stage 4 (severe): Secondary | ICD-10-CM | POA: Diagnosis present

## 2019-09-16 DIAGNOSIS — I129 Hypertensive chronic kidney disease with stage 1 through stage 4 chronic kidney disease, or unspecified chronic kidney disease: Secondary | ICD-10-CM | POA: Diagnosis present

## 2019-09-16 DIAGNOSIS — J9600 Acute respiratory failure, unspecified whether with hypoxia or hypercapnia: Secondary | ICD-10-CM | POA: Diagnosis not present

## 2019-09-16 DIAGNOSIS — E785 Hyperlipidemia, unspecified: Secondary | ICD-10-CM | POA: Diagnosis present

## 2019-09-16 HISTORY — DX: Sleep apnea, unspecified: G47.30

## 2019-09-16 LAB — URINALYSIS, ROUTINE W REFLEX MICROSCOPIC
Bacteria, UA: NONE SEEN
Bilirubin Urine: NEGATIVE
Glucose, UA: >=500 mg/dL — AB
Ketones, ur: 5 mg/dL — AB
Leukocytes,Ua: NEGATIVE
Nitrite: NEGATIVE
Protein, ur: >=300 mg/dL — AB
Specific Gravity, Urine: 1.021 (ref 1.005–1.030)
pH: 5 (ref 5.0–8.0)

## 2019-09-16 LAB — CBC WITH DIFFERENTIAL/PLATELET
Abs Immature Granulocytes: 0.06 10*3/uL (ref 0.00–0.07)
Basophils Absolute: 0 10*3/uL (ref 0.0–0.1)
Basophils Relative: 0 %
Eosinophils Absolute: 0 10*3/uL (ref 0.0–0.5)
Eosinophils Relative: 0 %
HCT: 50.7 % (ref 39.0–52.0)
Hemoglobin: 16.7 g/dL (ref 13.0–17.0)
Immature Granulocytes: 1 %
Lymphocytes Relative: 8 %
Lymphs Abs: 0.5 10*3/uL — ABNORMAL LOW (ref 0.7–4.0)
MCH: 33 pg (ref 26.0–34.0)
MCHC: 32.9 g/dL (ref 30.0–36.0)
MCV: 100.2 fL — ABNORMAL HIGH (ref 80.0–100.0)
Monocytes Absolute: 0.7 10*3/uL (ref 0.1–1.0)
Monocytes Relative: 11 %
Neutro Abs: 5.4 10*3/uL (ref 1.7–7.7)
Neutrophils Relative %: 80 %
Platelets: 111 10*3/uL — ABNORMAL LOW (ref 150–400)
RBC: 5.06 MIL/uL (ref 4.22–5.81)
RDW: 14.1 % (ref 11.5–15.5)
WBC: 6.7 10*3/uL (ref 4.0–10.5)
nRBC: 0 % (ref 0.0–0.2)

## 2019-09-16 LAB — TRIGLYCERIDES: Triglycerides: 272 mg/dL — ABNORMAL HIGH (ref <150)

## 2019-09-16 LAB — COMPREHENSIVE METABOLIC PANEL
ALT: 57 U/L — ABNORMAL HIGH (ref 0–44)
AST: 91 U/L — ABNORMAL HIGH (ref 15–41)
Albumin: 3.7 g/dL (ref 3.5–5.0)
Alkaline Phosphatase: 38 U/L (ref 38–126)
Anion gap: 16 — ABNORMAL HIGH (ref 5–15)
BUN: 55 mg/dL — ABNORMAL HIGH (ref 8–23)
CO2: 17 mmol/L — ABNORMAL LOW (ref 22–32)
Calcium: 8.9 mg/dL (ref 8.9–10.3)
Chloride: 101 mmol/L (ref 98–111)
Creatinine, Ser: 2.67 mg/dL — ABNORMAL HIGH (ref 0.61–1.24)
GFR calc Af Amer: 25 mL/min — ABNORMAL LOW (ref >60)
GFR calc non Af Amer: 22 mL/min — ABNORMAL LOW (ref >60)
Glucose, Bld: 405 mg/dL — ABNORMAL HIGH (ref 70–99)
Potassium: 4.8 mmol/L (ref 3.5–5.1)
Sodium: 134 mmol/L — ABNORMAL LOW (ref 135–145)
Total Bilirubin: 1.4 mg/dL — ABNORMAL HIGH (ref 0.3–1.2)
Total Protein: 7.7 g/dL (ref 6.5–8.1)

## 2019-09-16 LAB — LACTATE DEHYDROGENASE: LDH: 547 U/L — ABNORMAL HIGH (ref 98–192)

## 2019-09-16 LAB — SODIUM, URINE, RANDOM: Sodium, Ur: 16 mmol/L

## 2019-09-16 LAB — D-DIMER, QUANTITATIVE: D-Dimer, Quant: 2.27 ug/mL-FEU — ABNORMAL HIGH (ref 0.00–0.50)

## 2019-09-16 LAB — RESPIRATORY PANEL BY RT PCR (FLU A&B, COVID)
Influenza A by PCR: NEGATIVE
Influenza B by PCR: NEGATIVE
SARS Coronavirus 2 by RT PCR: POSITIVE — AB

## 2019-09-16 LAB — FERRITIN: Ferritin: 707 ng/mL — ABNORMAL HIGH (ref 24–336)

## 2019-09-16 LAB — ABO/RH: ABO/RH(D): A NEG

## 2019-09-16 LAB — CBG MONITORING, ED
Glucose-Capillary: 391 mg/dL — ABNORMAL HIGH (ref 70–99)
Glucose-Capillary: 361 mg/dL — ABNORMAL HIGH (ref 70–99)
Glucose-Capillary: 450 mg/dL — ABNORMAL HIGH (ref 70–99)

## 2019-09-16 LAB — FIBRINOGEN: Fibrinogen: 657 mg/dL — ABNORMAL HIGH (ref 210–475)

## 2019-09-16 LAB — C-REACTIVE PROTEIN: CRP: 12.5 mg/dL — ABNORMAL HIGH (ref <1.0)

## 2019-09-16 LAB — PROCALCITONIN: Procalcitonin: 0.41 ng/mL

## 2019-09-16 LAB — LACTIC ACID, PLASMA
Lactic Acid, Venous: 2.8 mmol/L (ref 0.5–1.9)
Lactic Acid, Venous: 1.2 mmol/L (ref 0.5–1.9)

## 2019-09-16 LAB — CREATININE, URINE, RANDOM: Creatinine, Urine: 211.61 mg/dL

## 2019-09-16 LAB — OSMOLALITY, URINE: Osmolality, Ur: 605 mOsm/kg (ref 300–900)

## 2019-09-16 MED ORDER — SODIUM CHLORIDE 0.9 % IV BOLUS
500.0000 mL | Freq: Once | INTRAVENOUS | Status: AC
  Administered 2019-09-16: 500 mL via INTRAVENOUS

## 2019-09-16 MED ORDER — NORTRIPTYLINE HCL 10 MG PO CAPS
10.0000 mg | ORAL_CAPSULE | Freq: Every day | ORAL | Status: DC
  Administered 2019-09-16 – 2019-09-19 (×4): 10 mg via ORAL
  Filled 2019-09-16 (×6): qty 1

## 2019-09-16 MED ORDER — ACETAMINOPHEN 500 MG PO TABS
ORAL_TABLET | ORAL | Status: AC
  Filled 2019-09-16: qty 1

## 2019-09-16 MED ORDER — INSULIN NPH (HUMAN) (ISOPHANE) 100 UNIT/ML ~~LOC~~ SUSP: 15.0000 [IU] | Freq: Two times a day (BID) | SUBCUTANEOUS | Status: DC

## 2019-09-16 MED ORDER — SODIUM CHLORIDE 0.9 % IV SOLN
100.0000 mg | Freq: Every day | INTRAVENOUS | Status: AC
  Administered 2019-09-17 – 2019-09-20 (×4): 100 mg via INTRAVENOUS
  Filled 2019-09-16 (×4): qty 20

## 2019-09-16 MED ORDER — ACETAMINOPHEN 325 MG PO TABS
650.0000 mg | ORAL_TABLET | Freq: Four times a day (QID) | ORAL | Status: DC | PRN
  Administered 2019-09-17: 17:00:00 650 mg via ORAL
  Filled 2019-09-16: qty 2

## 2019-09-16 MED ORDER — GUAIFENESIN-DM 100-10 MG/5ML PO SYRP: 10.0000 mL | ORAL_SOLUTION | ORAL | Status: DC | PRN

## 2019-09-16 MED ORDER — HEPARIN SODIUM (PORCINE) 10000 UNIT/ML IJ SOLN
7500.0000 [IU] | Freq: Three times a day (TID) | INTRAMUSCULAR | Status: DC
  Administered 2019-09-16 – 2019-09-20 (×11): 7500 [IU] via SUBCUTANEOUS
  Filled 2019-09-16 (×13): qty 1

## 2019-09-16 MED ORDER — OXYCODONE-ACETAMINOPHEN 5-325 MG PO TABS
1.0000 | ORAL_TABLET | ORAL | Status: DC | PRN
  Administered 2019-09-20: 08:00:00 1 via ORAL
  Filled 2019-09-16: qty 1

## 2019-09-16 MED ORDER — ONDANSETRON HCL 4 MG PO TABS: 4.0000 mg | ORAL_TABLET | Freq: Four times a day (QID) | ORAL | Status: DC | PRN

## 2019-09-16 MED ORDER — METHYLPREDNISOLONE SODIUM SUCC 125 MG IJ SOLR
0.5000 mg/kg | Freq: Two times a day (BID) | INTRAMUSCULAR | Status: DC
  Administered 2019-09-16 – 2019-09-19 (×6): 65.625 mg via INTRAVENOUS
  Filled 2019-09-16 (×6): qty 2

## 2019-09-16 MED ORDER — INSULIN ASPART 100 UNIT/ML ~~LOC~~ SOLN
0.0000 [IU] | Freq: Three times a day (TID) | SUBCUTANEOUS | Status: DC
  Administered 2019-09-16: 15 [IU] via SUBCUTANEOUS
  Filled 2019-09-16: qty 0.15

## 2019-09-16 MED ORDER — POLYETHYLENE GLYCOL 3350 17 G PO PACK: 17.0000 g | PACK | Freq: Every day | ORAL | Status: DC | PRN

## 2019-09-16 MED ORDER — IPRATROPIUM-ALBUTEROL 20-100 MCG/ACT IN AERS
1.0000 | INHALATION_SPRAY | Freq: Four times a day (QID) | RESPIRATORY_TRACT | Status: DC
  Administered 2019-09-17 – 2019-09-20 (×13): 1 via RESPIRATORY_TRACT
  Filled 2019-09-16 (×2): qty 4

## 2019-09-16 MED ORDER — DEXAMETHASONE SODIUM PHOSPHATE 10 MG/ML IJ SOLN
10.0000 mg | Freq: Once | INTRAMUSCULAR | Status: AC
  Administered 2019-09-16: 10 mg via INTRAVENOUS
  Filled 2019-09-16: qty 1

## 2019-09-16 MED ORDER — METRONIDAZOLE IN NACL 5-0.79 MG/ML-% IV SOLN
500.0000 mg | Freq: Once | INTRAVENOUS | Status: AC
  Administered 2019-09-16: 500 mg via INTRAVENOUS
  Filled 2019-09-16: qty 100

## 2019-09-16 MED ORDER — INSULIN DETEMIR 100 UNIT/ML ~~LOC~~ SOLN
12.0000 [IU] | Freq: Two times a day (BID) | SUBCUTANEOUS | Status: DC
  Administered 2019-09-16: 12 [IU] via SUBCUTANEOUS
  Filled 2019-09-16 (×2): qty 0.12

## 2019-09-16 MED ORDER — ENOXAPARIN SODIUM 40 MG/0.4ML ~~LOC~~ SOLN: 40.0000 mg | SUBCUTANEOUS | Status: DC

## 2019-09-16 MED ORDER — ASCORBIC ACID 500 MG PO TABS
500.0000 mg | ORAL_TABLET | Freq: Every day | ORAL | Status: DC
  Administered 2019-09-16 – 2019-09-20 (×5): 500 mg via ORAL
  Filled 2019-09-16 (×5): qty 1

## 2019-09-16 MED ORDER — ALLOPURINOL 100 MG PO TABS
100.0000 mg | ORAL_TABLET | Freq: Every day | ORAL | Status: DC
  Administered 2019-09-17 – 2019-09-20 (×4): 100 mg via ORAL
  Filled 2019-09-16 (×4): qty 1

## 2019-09-16 MED ORDER — SODIUM CHLORIDE 0.9 % IV SOLN: INTRAVENOUS | Status: AC

## 2019-09-16 MED ORDER — ASPIRIN EC 81 MG PO TBEC
81.0000 mg | DELAYED_RELEASE_TABLET | Freq: Every day | ORAL | Status: DC
  Administered 2019-09-16 – 2019-09-20 (×5): 81 mg via ORAL
  Filled 2019-09-16 (×5): qty 1

## 2019-09-16 MED ORDER — ACETAMINOPHEN 500 MG PO TABS
500.0000 mg | ORAL_TABLET | Freq: Once | ORAL | Status: AC
  Administered 2019-09-16: 500 mg via ORAL

## 2019-09-16 MED ORDER — SODIUM CHLORIDE 0.9 % IV SOLN
2.0000 g | Freq: Once | INTRAVENOUS | Status: AC
  Administered 2019-09-16: 2 g via INTRAVENOUS
  Filled 2019-09-16: qty 2

## 2019-09-16 MED ORDER — SODIUM CHLORIDE 0.9 % IV SOLN: 100.0000 mg | Freq: Every day | INTRAVENOUS | Status: DC

## 2019-09-16 MED ORDER — ACETAMINOPHEN 650 MG RE SUPP: 650.0000 mg | Freq: Four times a day (QID) | RECTAL | Status: DC | PRN

## 2019-09-16 MED ORDER — INSULIN ASPART 100 UNIT/ML ~~LOC~~ SOLN
0.0000 [IU] | Freq: Every day | SUBCUTANEOUS | Status: DC
  Administered 2019-09-17: 21:00:00 5 [IU] via SUBCUTANEOUS
  Filled 2019-09-16: qty 0.05

## 2019-09-16 MED ORDER — METHYLPREDNISOLONE SODIUM SUCC 125 MG IJ SOLR: 0.5000 mg/kg | Freq: Two times a day (BID) | INTRAMUSCULAR | Status: DC

## 2019-09-16 MED ORDER — ZINC SULFATE 220 (50 ZN) MG PO CAPS
220.0000 mg | ORAL_CAPSULE | Freq: Every day | ORAL | Status: DC
  Administered 2019-09-16 – 2019-09-20 (×5): 220 mg via ORAL
  Filled 2019-09-16 (×5): qty 1

## 2019-09-16 MED ORDER — SODIUM CHLORIDE 0.9 % IV SOLN
200.0000 mg | Freq: Once | INTRAVENOUS | Status: AC
  Administered 2019-09-16: 200 mg via INTRAVENOUS
  Filled 2019-09-16: qty 200

## 2019-09-16 MED ORDER — VANCOMYCIN HCL IN DEXTROSE 1-5 GM/200ML-% IV SOLN: 1000.0000 mg | Freq: Once | INTRAVENOUS | Status: DC

## 2019-09-16 MED ORDER — METOPROLOL TARTRATE 25 MG PO TABS
12.5000 mg | ORAL_TABLET | Freq: Two times a day (BID) | ORAL | Status: DC
  Administered 2019-09-16 – 2019-09-20 (×8): 12.5 mg via ORAL
  Filled 2019-09-16 (×8): qty 1

## 2019-09-16 MED ORDER — IBUPROFEN 800 MG PO TABS: 800.0000 mg | ORAL_TABLET | Freq: Once | ORAL | Status: DC

## 2019-09-16 MED ORDER — ONDANSETRON HCL 4 MG/2ML IJ SOLN
4.0000 mg | Freq: Four times a day (QID) | INTRAMUSCULAR | Status: DC | PRN
  Administered 2019-09-20: 4 mg via INTRAVENOUS
  Filled 2019-09-16: qty 2

## 2019-09-16 MED ORDER — HYDRALAZINE HCL 25 MG PO TABS: 25.0000 mg | ORAL_TABLET | Freq: Four times a day (QID) | ORAL | Status: DC | PRN

## 2019-09-16 MED ORDER — VANCOMYCIN HCL 10 G IV SOLR
2500.0000 mg | INTRAVENOUS | Status: AC
  Administered 2019-09-16: 2500 mg via INTRAVENOUS
  Filled 2019-09-16: qty 2000

## 2019-09-16 MED ORDER — SODIUM CHLORIDE 0.9 % IV SOLN
200.0000 mg | Freq: Once | INTRAVENOUS | Status: DC
  Filled 2019-09-16: qty 40

## 2019-09-16 MED ORDER — LINAGLIPTIN 5 MG PO TABS
5.0000 mg | ORAL_TABLET | Freq: Every day | ORAL | Status: DC
  Administered 2019-09-17 – 2019-09-20 (×4): 5 mg via ORAL
  Filled 2019-09-16 (×4): qty 1

## 2019-09-16 MED ORDER — INSULIN ASPART 100 UNIT/ML ~~LOC~~ SOLN
12.0000 [IU] | Freq: Once | SUBCUTANEOUS | Status: AC
  Administered 2019-09-16: 12 [IU] via SUBCUTANEOUS
  Filled 2019-09-16: qty 0.12

## 2019-09-16 NOTE — ED Notes (Signed)
[facility] called for patient transport to [place]. Papers at nurse's station.

## 2019-09-16 NOTE — Progress Notes (Signed)
Inpatient Diabetes Program Recommendations  AACE/ADA: New Consensus Statement on Inpatient Glycemic Control (2015)  Target Ranges:  Prepandial:   less than 140 mg/dL      Peak postprandial:   less than 180 mg/dL (1-2 hours)      Critically ill patients:  140 - 180 mg/dL   Lab Results  Component Value Date   GLUCAP 391 (H) 09/11/2019   HGBA1C 6.3 (H) 04/22/2011    Review of Glycemic Control  Diabetes history: DM 2 Outpatient Diabetes medications: NPH 20 units qhs, Novolog regular insulin 14 units tid Current orders for Inpatient glycemic control:  NPH 15 units qhs Novolog 0-15 units tid + hs  Decadron 10 mg  Inpatient Diabetes Program Recommendations:    If admitted utilize the COVID glycemic control order set -  Adding Tradjenta 5 mg Daily (improves mortality in DM 2 pts with COVID) -  Change NPH to Levemir 12 units bid  Thanks,  Tama Headings RN, MSN, BC-ADM Inpatient Diabetes Coordinator Team Pager [phone] (8a-5p)

## 2019-09-16 NOTE — H&P (Addendum)
History and Physical    Jonathan Roman [id number] DOB: 12-09-1940 DOA: 09/09/2019  PCP: [name], MD  Patient coming from: Home  I have personally briefly reviewed patient's old medical records in [place]  Chief Complaint: Shortness of breath  HPI: Jonathan Roman is a 79 y.o. male with medical history significant of gout, HTN, HLD, squamous cell carcinoma left hand, history of prior tobacco abuse, reported recent positive Covid-19 virus on 09/06/2019 who presents with progressive shortness of breath over the past week.  On presentation he was noted to be oxygenating 72% on room air.  Patient reports decreased appetite/oral intake, fatigue and generalized weakness during this timeframe as well.  No other specific complaints at this time.  Denies headache, no chest pain, palpitations, no abdominal pain.  ED Course: Temperature 100.6, HR 140, RR 35, BP 177/87, SPO2 72% on room air, improved to 92% on 12 L nasal cannula.  WBC count 6.7, hemoglobin 16.4, platelets 111.  Sodium 134, potassium 4.8, chloride 101, CO2 17, BUN 55, creatinine 2.67, glucose 405.  AST 91, ALT 57.  CRP 12.5, D-dimer 2.27, ferritin 707, lactic acid 2.8, procalcitonin 0.41, LDH 547.  Chest x-ray with increased interstitial markings within perihilar, bibasilar regions left greater than right, consistent with edema versus atypical versus viral infection.  Blood cultures x2: Pending. Respiratory viral panel with Covid-19/influenza: Pending at time of admission.  Patient does not have documentation, nor review of EMR notes his positive Covid test at this time.  Patient was given empiric antibiotics with vancomycin, Flagyl, cefepime.  Started on Decadron 10 mg IV and given a 500 mL NS bolus.  EDP referred patient for admission for acute hypoxic respiratory failure likely secondary to Covid-19 viral pneumonia.  Review of Systems: As per HPI otherwise 10 point review of systems negative.    Past Medical History:   Diagnosis Date  . Coronary artery disease   . Diabetes mellitus without complication ([place])   . Hyperlipemia   . Renal disorder     Past Surgical History:  Procedure Laterality Date  . CARDIAC SURGERY       reports that he quit smoking about 40 years ago. His smoking use included cigarettes. He smoked 1.00 pack per day. He has never used smokeless tobacco. He reports that he does not drink alcohol or use drugs.  Allergies  Allergen Reactions  . Statins Rash    No family history on file.  Family history reviewed and not pertinent   Prior to Admission medications   Medication Sig Start Date End Date Taking? Authorizing Provider  allopurinol (ZYLOPRIM) 100 MG tablet Take 100 mg by mouth daily.    [provider]  amoxicillin (AMOXIL) 500 MG capsule Take 500 mg by mouth 2 (two) times daily. 03/08/16   [provider]  aspirin EC 81 MG tablet Take 81 mg by mouth daily.    [provider]  Cholecalciferol (VITAMIN D-3) 1000 units CAPS Take 1,000 Units by mouth daily.    [provider]  colchicine 0.6 MG tablet Take 1 tablet (0.6 mg total) by mouth 2 (two) times daily. 03/13/16   [name], MD  fenofibrate (TRICOR) 48 MG tablet Take 48 mg by mouth daily.    [provider]  insulin NPH Human (HUMULIN N,NOVOLIN N) 100 UNIT/ML injection Inject 20 Units into the skin at bedtime.    [provider]  insulin regular (NOVOLIN R,HUMULIN R) 100 units/mL injection Inject 14 Units into the skin  3 (three) times daily before meals.    [provider]  lisinopril (PRINIVIL,ZESTRIL) 20 MG tablet Take 20 mg by mouth daily.    [provider]  metoprolol tartrate (LOPRESSOR) 25 MG tablet Take 12.5 mg by mouth 2 (two) times daily.    [provider]  niacin 500 MG tablet Take 500 mg by mouth 2 (two) times daily.    [provider]  nortriptyline (PAMELOR) 10 MG capsule Take 10 mg by mouth at bedtime.     [provider]  Omega-3 Fatty Acids (FISH OIL) 1000 MG CPDR Take 1,000 mg by mouth daily.    [provider]  oxyCODONE-acetaminophen (PERCOCET/ROXICET) 5-325 MG tablet Take 1 tablet by mouth every 4 (four) hours as needed for severe pain. 03/13/16   [name], MD  simvastatin (ZOCOR) 40 MG tablet Take 40 mg by mouth daily.    [provider]    Physical Exam: Vitals:   09/21/2019 1446 09/01/2019 1500 09/19/2019 1530 09/19/2019 1531  BP:  125/78 130/78   Pulse: (!) 126  (!) 118   Resp: (!) 34 (!) 31 (!) 27   Temp:      SpO2: 94% 96% 92% 92%  Weight:      Height:        Constitutional: NAD, calm, slightly ill in appearance; appears fatigued Eyes: PERRL, lids and conjunctivae normal ENMT: Mucous membranes are dry. Posterior pharynx clear of any exudate or lesions.Normal dentition.  Neck: normal, supple, no masses, no thyromegaly Respiratory: Breath sounds slightly decreased bilateral bases, no wheezing/crackles, slightly increased respiratory effort, no accessory muscle use, oxygenating 92% on 12 L nasal cannula Cardiovascular: Tachycardic, regular rhythm, no murmurs / rubs / gallops. No extremity edema. 2+ pedal pulses. No carotid bruits.  Abdomen: no tenderness, no masses palpated. No hepatosplenomegaly. Bowel sounds positive.  Musculoskeletal: no clubbing / cyanosis. No joint deformity upper and lower extremities. Good ROM, no contractures. Normal muscle tone.  Skin: no rashes, lesions, ulcers. No induration Neurologic: CN 2-12 grossly intact. Sensation intact, DTR normal. Strength 5/5 in all 4.  Psychiatric: Normal judgment and insight. Alert and oriented x 3. Normal mood.    Labs on Admission: I have personally reviewed following labs and imaging studies  CBC: Recent Labs  Lab 09/04/2019 1154  WBC 6.7  NEUTROABS 5.4  HGB 16.7  HCT 50.7  MCV 100.2*  PLT 99991111*   Basic Metabolic Panel: Recent Labs  Lab 09/18/2019 1154  NA 134*  K 4.8  CL 101  CO2  17*  GLUCOSE 405*  BUN 55*  CREATININE 2.67*  CALCIUM 8.9   GFR: Estimated Creatinine Clearance: 30.3 mL/min (A) (by C-G formula based on SCr of 2.67 mg/dL (H)). Liver Function Tests: Recent Labs  Lab 09/09/2019 1154  AST 91*  ALT 57*  ALKPHOS 38  BILITOT 1.4*  PROT 7.7  ALBUMIN 3.7   No results for input(s): LIPASE, AMYLASE in the last 168 hours. No results for input(s): AMMONIA in the last 168 hours. Coagulation Profile: No results for input(s): INR, PROTIME in the last 168 hours. Cardiac Enzymes: No results for input(s): CKTOTAL, CKMB, CKMBINDEX, TROPONINI in the last 168 hours. BNP (last 3 results) No results for input(s): PROBNP in the last 8760 hours. HbA1C: No results for input(s): HGBA1C in the last 72 hours. CBG: Recent Labs  Lab 09/15/2019 1304  GLUCAP 391*   Lipid Profile: Recent Labs    09/17/2019 1154  TRIG 272*   Thyroid Function Tests: No results  for input(s): TSH, T4TOTAL, FREET4, T3FREE, THYROIDAB in the last 72 hours. Anemia Panel: Recent Labs    09/18/2019 1154  FERRITIN 707*   Urine analysis:    Component Value Date/Time   COLORURINE YELLOW 04/13/2011 [place] 04/13/2011 0955   LABSPEC 1.012 04/13/2011 0955   PHURINE 6.5 04/13/2011 [place] 04/13/2011 0955   HGBUR NEGATIVE 04/13/2011 0955   BILIRUBINUR NEGATIVE 04/13/2011 0955   KETONESUR NEGATIVE 04/13/2011 0955   PROTEINUR NEGATIVE 04/13/2011 0955   UROBILINOGEN 0.2 04/13/2011 0955   NITRITE NEGATIVE 04/13/2011 0955   LEUKOCYTESUR NEGATIVE 04/13/2011 0955    Radiological Exams on Admission: DG Chest Port 1 View  Result Date: 09/19/2019 CLINICAL DATA:  Shortness of breath, hypoxia EXAM: PORTABLE CHEST 1 VIEW COMPARISON:  04/13/2011 FINDINGS: Prior CABG. Heart size is stable prior. Lung volumes are low. Increased interstitial markings within the perihilar and bibasilar regions, left greater than right. No significant pleural fluid collection. No  pneumothorax. IMPRESSION: Increased interstitial markings within the perihilar and bibasilar regions, left greater than right. Findings could reflect edema versus atypical/viral infection. Electronically Signed   By: Davina Poke D.O.   On: 09/06/2019 12:35    EKG: Independently reviewed.   Assessment/Plan Active Problems:   Acute respiratory failure with hypoxia (HCC)    Acute hypoxic respiratory failure secondary to acute Covid-19 viral pneumonia during the ongoing 2 Covid 19 Pandemic - POA Patient presenting with progressive shortness of breath over the past week.  Reports positive Covid-19 test at an outside side facility on 09/06/2019 was positive.  Noted to be hypoxic on presentation.  SPO2 72% on room air.  Patient with elevated inflammatory markers to include CRP 12.5, D-dimer 2.27, ferritin 707, LDH 547. --Admit to [facility], progressive unit --Remdesivir, plan 5-day course --Solu-Medrol 0.5 mg/kg IV twice daily --Combivent MDI every 6 hours --Continue supplemental oxygen, titrate to maintain SPO2 greater than 92%; currently on 12 L nasal cannula --Continue supportive care with vitamin C, zinc, Tylenol --Follow CBC, CMP, D-dimer, ferritin, and CRP daily --Continue airborne/contact isolation precautions  Hyponatremia Sodium 134 on presentation.  Likely secondary to hypovolemic hyponatremia secondary to poor oral intake in the preceding days.  Received 500 mL NS bolus in ED. --Check urine osmolality, urine sodium --Continue IV fluid hydration with NS at 75 mL's per hour x1 day --Repeat BMP in a.m.  Essential hypertension --Continue metoprolol tartrate 25 mg p.o. twice daily --Holding lisinopril, given mild bump in creatinine --Hydralazine 25 mg p.o. every 6 hours prn SBP >170 or DBP >110 --Continue monitor blood pressure closely  HLD: --Holding home statin, fenofibrate secondary to transaminitis --Follow LFTs daily  Type 2 diabetes mellitus Hemoglobin A1c 6.3  in August 2012.  Currently on NPH 20 units subcutaneously nightly and Novolin 14 units 3 times daily. --Repeat hemoglobin A1c --Levemir 12 units subcutaneously twice daily --Tradjenta 5 mg p.o. daily --Moderate insulin sliding scale for further coverage --Continue to monitor CBGs closely especially in the setting of IV steroid use  Morbid obesity Body mass index is 41.02 kg/m.  Discussed with patient needs aggressive lifestyle changes, weight loss as this complicates all facets of care.    DVT prophylaxis: Heparin Code Status: Full code Family Communication: Updated patient's daughter, Colletta [place] via telephone this afternoon Disposition Plan: Anticipate discharge home when medically ready Consults called: None Admission status: Inpatient   Severity of Illness: The appropriate patient status for this patient is INPATIENT. Inpatient status is judged to be reasonable and  necessary in order to provide the required intensity of service to ensure the patient's safety. The patient's presenting symptoms, physical exam findings, and initial radiographic and laboratory data in the context of their chronic comorbidities is felt to place them at high risk for further clinical deterioration. Furthermore, it is not anticipated that the patient will be medically stable for discharge from the hospital within 2 midnights of admission. The following factors support the patient status of inpatient.   " The patient's presenting symptoms include weakness, fatigue, dyspnea " The worrisome physical exam findings include hypoxia, increased respiratory rate, fever " The initial radiographic and laboratory data are worrisome because of elevated inflammatory markers, chest x-ray with findings consistent with multifocal pneumonia " The chronic co-morbidities include obesity, type 2 diabetes mellitus, hyperlipidemia, hypertension, gout.   * I certify that at the point of admission it is my clinical judgment that the  patient will require inpatient hospital care spanning beyond 2 midnights from the point of admission due to high intensity of service, high risk for further deterioration and high frequency of surveillance required.*    Kenia Teagarden J [place] DO [facility] Available via Epic secure chat 7am-7pm After these hours, please refer to coverage provider listed on [email or web address] 09/09/2019, 3:44 PM

## 2019-09-16 NOTE — Progress Notes (Signed)
A consult was received from an ED provider for Vancomycin and Cefepime per pharmacy dosing.  The patient's profile has been reviewed for ht/wt/allergies/indication/available labs.    A one time order has been placed for Vancomycin 2500mg  IV and Cefepime 2g IV.  Further antibiotics/pharmacy consults should be ordered by admitting physician if indicated.                       Thank you, Luiz Ochoa 09/10/2019  12:28 PM

## 2019-09-16 NOTE — ED Provider Notes (Signed)
[name] DEPT Provider Note   CSN: [id number] Arrival date & time: 09/07/2019  1144     History No chief complaint on file.   Jonathan Roman is a 79 y.o. male with a past medical history of diabetes, CAD, hyperlipidemia, and mild renal insufficiency.  He presents the emergency department with chief complaint of shortness of breath.  Patient states that he developed symptoms of Covid 19 on 09/06/2019.  He was tested and his results came back positive that day.  He states that approximately 3 days ago he began having worsening shortness of breath.  Patient came in for mild respiratory distress.  He is notably tachypneic and tachycardic.  Feels extremely hot to the touch.  He denies any nausea, vomiting, diarrhea.  He has had poor appetite.  HPI     Past Medical History:  Diagnosis Date  . Coronary artery disease   . Diabetes mellitus without complication ([place])   . Hyperlipemia   . Renal disorder   . Sleep apnea     Patient Active Problem List   Diagnosis Date Noted  . Acute respiratory failure with hypoxia ([place]) 09/08/2019  . Person under investigation for COVID-19 09/15/2019  . Pneumonia due to COVID-19 virus 09/15/2019  . Essential hypertension 09/13/2019  . Gout 08/26/2019  . Hyperlipidemia 09/06/2019  . Obesity, Class III, BMI 40-49.9 (morbid obesity) ([place]) 09/08/2019  . CKD (chronic kidney disease), stage IV ([place]) 09/08/2019  . Type 2 diabetes mellitus with stage 4 chronic kidney disease ([place]) 09/08/2019    Past Surgical History:  Procedure Laterality Date  . CARDIAC SURGERY    . CHOLECYSTECTOMY    . CORONARY ARTERY BYPASS GRAFT  2009  . NOSE SURGERY         History reviewed. No pertinent family history.  Social History   Tobacco Use  . Smoking status: Former Smoker    Packs/day: 1.00    Types: Cigarettes    Quit date: 1981    Years since quitting: 40.0  . Smokeless tobacco: Never Used  Substance Use Topics  . Alcohol use:  No  . Drug use: No    Home Medications Prior to Admission medications   Medication Sig Start Date End Date Taking? Authorizing Provider  allopurinol (ZYLOPRIM) 100 MG tablet Take 100 mg by mouth daily.   Yes [provider]  aspirin EC 81 MG tablet Take 81 mg by mouth daily.   Yes [provider]  Cholecalciferol (VITAMIN D-3) 1000 units CAPS Take 1,000 Units by mouth daily.   Yes [provider]  finasteride (PROSCAR) 5 MG tablet Take 5 mg by mouth daily.   Yes [provider]  insulin NPH Human (HUMULIN N,NOVOLIN N) 100 UNIT/ML injection Inject 25 Units into the skin at bedtime.    Yes [provider]  insulin regular (NOVOLIN R,HUMULIN R) 100 units/mL injection Inject 14 Units into the skin 3 (three) times daily before meals.   Yes [provider]  lisinopril (PRINIVIL,ZESTRIL) 20 MG tablet Take 20 mg by mouth daily.   Yes [provider]  metoprolol tartrate (LOPRESSOR) 50 MG tablet Take 25 mg by mouth 2 (two) times daily.    Yes [provider]  niacin 500 MG tablet Take 500 mg by mouth 2 (two) times daily.   Yes [provider]  nortriptyline (PAMELOR) 10 MG capsule Take 10 mg by mouth at bedtime.   Yes [provider]  Omega-3 Fatty Acids (FISH OIL) 1000 MG  CPDR Take 1,000 mg by mouth daily.   Yes [provider]  simvastatin (ZOCOR) 20 MG tablet Take 20 mg by mouth at bedtime.   Yes [provider]  colchicine 0.6 MG tablet Take 1 tablet (0.6 mg total) by mouth 2 (two) times daily. Patient not taking: Reported on 09/05/2019 03/13/16   [name], MD  oxyCODONE-acetaminophen (PERCOCET/ROXICET) 5-325 MG tablet Take 1 tablet by mouth every 4 (four) hours as needed for severe pain. Patient not taking: Reported on 09/11/2019 03/13/16   [name], MD    Allergies    Statins  Review of Systems   Review of Systems Ten systems reviewed and are negative for acute change, except  as noted in the HPI.   Physical Exam Updated Vital Signs BP 123/85 (BP Location: Left Arm)   Pulse 86   Temp 99.1 F (37.3 C) (Oral)   Resp (!) 26   Ht 5\' 10"  (1.778 m)   Wt 131.5 kg   SpO2 91%   BMI 41.61 kg/m   Physical Exam Vitals and nursing note reviewed.  Constitutional:      General: He is not in acute distress.    Appearance: He is well-developed. He is obese. He is ill-appearing and toxic-appearing. He is not diaphoretic.  HENT:     Head: Normocephalic and atraumatic.  Eyes:     General: No scleral icterus.    Conjunctiva/sclera: Conjunctivae normal.  Cardiovascular:     Rate and Rhythm: Regular rhythm. Tachycardia present.     Heart sounds: Normal heart sounds.  Pulmonary:     Effort: Pulmonary effort is normal. Tachypnea present. No respiratory distress.     Breath sounds: Normal breath sounds.  Abdominal:     Palpations: Abdomen is soft.     Tenderness: There is no abdominal tenderness.  Musculoskeletal:     Cervical back: Normal range of motion and neck supple.  Skin:    General: Skin is warm and dry.     Comments: Hot to touch  Neurological:     Mental Status: He is alert.  Psychiatric:        Behavior: Behavior normal.     ED Results / Procedures / Treatments   Labs (all labs ordered are listed, but only abnormal results are displayed) Labs Reviewed  RESPIRATORY PANEL BY RT PCR (FLU A&B, COVID) - Abnormal; Notable for the following components:      Result Value   SARS Coronavirus 2 by RT PCR POSITIVE (*)    All other components within normal limits  LACTIC ACID, PLASMA - Abnormal; Notable for the following components:   Lactic Acid, Venous 2.8 (*)    All other components within normal limits  CBC WITH DIFFERENTIAL/PLATELET - Abnormal; Notable for the following components:   MCV 100.2 (*)    Platelets 111 (*)    Lymphs Abs 0.5 (*)    All other components within normal limits  COMPREHENSIVE METABOLIC PANEL - Abnormal; Notable for the  following components:   Sodium 134 (*)    CO2 17 (*)    Glucose, Bld 405 (*)    BUN 55 (*)    Creatinine, Ser 2.67 (*)    AST 91 (*)    ALT 57 (*)    Total Bilirubin 1.4 (*)    GFR calc non Af Amer 22 (*)    GFR calc Af Amer 25 (*)    Anion gap 16 (*)    All other components within normal limits  D-DIMER, QUANTITATIVE (NOT AT [facility]) - Abnormal; Notable for the following components:   D-Dimer, Quant 2.27 (*)    All other components within normal limits  LACTATE DEHYDROGENASE - Abnormal; Notable for the following components:   LDH 547 (*)    All other components within normal limits  FERRITIN - Abnormal; Notable for the following components:   Ferritin 707 (*)    All other components within normal limits  TRIGLYCERIDES - Abnormal; Notable for the following components:   Triglycerides 272 (*)    All other components within normal limits  FIBRINOGEN - Abnormal; Notable for the following components:   Fibrinogen 657 (*)    All other components within normal limits  C-REACTIVE PROTEIN - Abnormal; Notable for the following components:   CRP 12.5 (*)    All other components within normal limits  URINALYSIS, ROUTINE W REFLEX MICROSCOPIC - Abnormal; Notable for the following components:   Glucose, UA >=500 (*)    Hgb urine dipstick LARGE (*)    Ketones, ur 5 (*)    Protein, ur >=300 (*)    All other components within normal limits  HEMOGLOBIN A1C - Abnormal; Notable for the following components:   Hgb A1c MFr Bld 8.3 (*)    All other components within normal limits  CBC WITH DIFFERENTIAL/PLATELET - Abnormal; Notable for the following components:   Platelets 118 (*)    Lymphs Abs 0.5 (*)    All other components within normal limits  COMPREHENSIVE METABOLIC PANEL - Abnormal; Notable for the following components:   CO2 20 (*)    Glucose, Bld 401 (*)    BUN 62 (*)    Creatinine, Ser 2.25 (*)    Calcium 8.4 (*)    Albumin 3.3 (*)    AST 77 (*)    ALT 52 (*)    Alkaline  Phosphatase 36 (*)    GFR calc non Af Amer 27 (*)    GFR calc Af Amer 31 (*)    All other components within normal limits  C-REACTIVE PROTEIN - Abnormal; Notable for the following components:   CRP 12.4 (*)    All other components within normal limits  D-DIMER, QUANTITATIVE (NOT AT [facility]) - Abnormal; Notable for the following components:   D-Dimer, Quant 1.99 (*)    All other components within normal limits  FERRITIN - Abnormal; Notable for the following components:   Ferritin 832 (*)    All other components within normal limits  GLUCOSE, CAPILLARY - Abnormal; Notable for the following components:   Glucose-Capillary 374 (*)    All other components within normal limits  CBG MONITORING, ED - Abnormal; Notable for the following components:   Glucose-Capillary 391 (*)    All other components within normal limits  CBG MONITORING, ED - Abnormal; Notable for the following components:   Glucose-Capillary 361 (*)    All other components within normal limits  CBG MONITORING, ED - Abnormal; Notable for the following components:   Glucose-Capillary 450 (*)    All other components within normal limits  CULTURE, BLOOD (ROUTINE X 2)  CULTURE, BLOOD (ROUTINE X 2)  MRSA PCR SCREENING  LACTIC ACID, PLASMA  PROCALCITONIN  CREATININE, URINE, RANDOM  SODIUM, URINE, RANDOM  OSMOLALITY, URINE  MAGNESIUM  TSH  BRAIN NATRIURETIC PEPTIDE  PROCALCITONIN  ABO/RH    EKG EKG Interpretation  Date/Time:  Monday September 16 2019 11:57:28 EST Ventricular Rate:  140 PR Interval:    QRS Duration: 103 QT Interval:  360 QTC  Calculation: 550 R Axis:   93 Text Interpretation: Supraventricular tachycardia Right axis deviation Low voltage, precordial leads Rate faster Confirmed by Ezequiel Essex [id number]) on 09/18/2019 12:55:13 PM   Radiology DG Chest Port 1 View  Result Date: 09/05/2019 CLINICAL DATA:  Shortness of breath, hypoxia EXAM: PORTABLE CHEST 1 VIEW COMPARISON:  04/13/2011 FINDINGS: Prior CABG.  Heart size is stable prior. Lung volumes are low. Increased interstitial markings within the perihilar and bibasilar regions, left greater than right. No significant pleural fluid collection. No pneumothorax. IMPRESSION: Increased interstitial markings within the perihilar and bibasilar regions, left greater than right. Findings could reflect edema versus atypical/viral infection. Electronically Signed   By: Davina Poke D.O.   On: 09/02/2019 12:35    Procedures .Critical Care Performed by: Margarita Mail, PA-C Authorized by: Margarita Mail, PA-C   Critical care provider statement:    Critical care time (minutes):  50   Critical care time was exclusive of:  Separately billable procedures and treating other patients   Critical care was necessary to treat or prevent imminent or life-threatening deterioration of the following conditions:  Respiratory failure   Critical care was time spent personally by me on the following activities:  Discussions with consultants, evaluation of patient's response to treatment, examination of patient, ordering and performing treatments and interventions, ordering and review of laboratory studies, ordering and review of radiographic studies, pulse oximetry, re-evaluation of patient's condition, obtaining history from patient or surrogate and review of old charts   (including critical care time)  Medications Ordered in ED Medications  acetaminophen (TYLENOL) 500 MG tablet (has no administration in time range)  allopurinol (ZYLOPRIM) tablet 100 mg (has no administration in time range)  aspirin EC tablet 81 mg (81 mg Oral Given 08/26/2019 1738)  oxyCODONE-acetaminophen (PERCOCET/ROXICET) 5-325 MG per tablet 1 tablet (has no administration in time range)  metoprolol tartrate (LOPRESSOR) tablet 12.5 mg (12.5 mg Oral Given 08/31/2019 2225)  nortriptyline (PAMELOR) capsule 10 mg (10 mg Oral Given 09/08/2019 2226)  acetaminophen (TYLENOL) tablet 650 mg (has no administration  in time range)    Or  acetaminophen (TYLENOL) suppository 650 mg (has no administration in time range)  polyethylene glycol (MIRALAX / GLYCOLAX) packet 17 g (has no administration in time range)  ondansetron (ZOFRAN) tablet 4 mg (has no administration in time range)    Or  ondansetron (ZOFRAN) injection 4 mg (has no administration in time range)  insulin aspart (novoLOG) injection 0-5 Units (0 Units Subcutaneous Not Given 09/02/2019 2234)  0.9 %  sodium chloride infusion ( Intravenous Rate/Dose Change 09/17/19 0820)  Ipratropium-Albuterol (COMBIVENT) respimat 1 puff (has no administration in time range)  guaiFENesin-dextromethorphan (ROBITUSSIN DM) 100-10 MG/5ML syrup 10 mL (has no administration in time range)  ascorbic acid (VITAMIN C) tablet 500 mg (500 mg Oral Given 08/25/2019 1737)  zinc sulfate capsule 220 mg (220 mg Oral Given 08/26/2019 1737)  remdesivir 200 mg in sodium chloride 0.9% 250 mL IVPB (0 mg Intravenous Stopped 09/08/2019 2234)    Followed by  remdesivir 100 mg in sodium chloride 0.9 % 100 mL IVPB (has no administration in time range)  methylPREDNISolone sodium succinate (SOLU-MEDROL) 125 mg/2 mL injection 65.625 mg (65.625 mg Intravenous Given 08/31/2019 2231)  heparin injection 7,500 Units (7,500 Units Subcutaneous Given 09/17/19 0535)  linagliptin (TRADJENTA) tablet 5 mg (has no administration in time range)  hydrALAZINE (APRESOLINE) tablet 25 mg (has no administration in time range)  insulin detemir (LEVEMIR) injection 20 Units (has no administration in time range)  insulin aspart (novoLOG) injection 4 Units (4 Units Subcutaneous Given 09/17/19 0821)  insulin aspart (novoLOG) injection 0-20 Units (20 Units Subcutaneous Given 09/17/19 0821)  famotidine (PEPCID) tablet 20 mg (has no administration in time range)  benzonatate (TESSALON) capsule 200 mg (has no administration in time range)  chlorpheniramine-HYDROcodone (TUSSIONEX) 10-8 MG/5ML suspension 5 mL (has no administration in time  range)  ceFEPIme (MAXIPIME) 2 g in sodium chloride 0.9 % 100 mL IVPB (0 g Intravenous Stopped 09/14/2019 1321)  metroNIDAZOLE (FLAGYL) IVPB 500 mg (0 mg Intravenous Stopped 09/12/2019 1321)  vancomycin (VANCOCIN) 2,500 mg in sodium chloride 0.9 % 500 mL IVPB (0 mg Intravenous Stopped 09/15/2019 1738)  dexamethasone (DECADRON) injection 10 mg (10 mg Intravenous Given 09/04/2019 1324)  sodium chloride 0.9 % bolus 500 mL (0 mLs Intravenous Stopped 09/10/2019 1430)  acetaminophen (TYLENOL) tablet 500 mg (500 mg Oral Given 08/26/2019 1430)  insulin aspart (novoLOG) injection 12 Units (12 Units Subcutaneous Given 08/23/2019 2228)    ED Course  I have reviewed the triage vital signs and the nursing notes.  Pertinent labs & imaging results that were available during my care of the patient were reviewed by me and considered in my medical decision making (see chart for details).    MDM Rules/Calculators/A&P                       CC:SOB, covid + VS: Febrile, tacychardic, normotensive, tachypneic [id number] is gathered by patient  and phone call with daughter, Colletta [place]. DDX:The emergent differential diagnosis for shortness of breath includes, but is not limited to, Pulmonary edema, bronchoconstriction, Pneumonia, Pulmonary embolism, Pneumotherax/ Hemothorax, Dysrythmia, ACS.  Labs: I reviewed the labs which show elevated inflammatory markers, elevated lactic acid, UA pending, Confirmatory Covid/ flu pending, CBC without Leukocytosis, Creatinine is at baseline. Elevated BUN  Imaging: I personally reviewed the images (CXR) which show(s) Multifocal inflitrates [id number] tachycardia NOT SVT at a rate of 140 [id number] here with acute hypoxic respiratory failure. Patient requiring High flow nasal canulla at 10 L to maintain 02 saturations above 90%. He is febrile and tachypneic. Patient treated with broad spectrum abx as sepsis in the differential without confirmatory COVID test, however CXR  And clinical appearance support  this dx. I have discussed the case with Critical care who recommend medicine admit. Dr. [place] has accepted the patient. I have given decadron here in the ED., 500 ml of fluid.  Patient disposition:admit Patient condition: acute/serious. The patient appears reasonably stabilized for admission considering the current resources, flow, and capabilities available in the ED at this time, and I doubt any other [facility] requiring further screening and/or treatment in the ED prior to admission.   Jonathan Roman was evaluated in Emergency Department on 09/17/2019 for the symptoms described in the history of present illness. He was evaluated in the context of the global COVID-19 pandemic, which necessitated consideration that the patient might be at risk for infection with the SARS-CoV-2 virus that causes COVID-19. Institutional protocols and algorithms that pertain to the evaluation of patients at risk for COVID-19 are in a state of rapid change based on information released by regulatory bodies including the CDC and federal and state organizations. These policies and algorithms were followed during the patient's care in the ED.   Final Clinical Impression(s) / ED Diagnoses Final diagnoses:  Acute respiratory failure due to COVID-19 [facility])    Rx / DC Orders ED Discharge Orders    None       Mahlon Gabrielle,  Maryjane Hurter 09/17/19 [id number]    Ezequiel Essex, MD 09/17/19 1021

## 2019-09-16 NOTE — ED Triage Notes (Signed)
Patient tested positive for COVID Jan 15th, has had SOB x1 week, presented with RA oxygen at 72%, tachypnic and tachycardic. 10 L [place] applied, patient at 92% on 10L.

## 2019-09-16 NOTE — ED Notes (Signed)
Pt's blood glucose was 450, MD paged by secretary

## 2019-09-17 ENCOUNTER — Inpatient Hospital Stay (HOSPITAL_COMMUNITY): Payer: No Typology Code available for payment source

## 2019-09-17 ENCOUNTER — Encounter (HOSPITAL_COMMUNITY): Payer: Self-pay | Admitting: Internal Medicine

## 2019-09-17 DIAGNOSIS — N184 Chronic kidney disease, stage 4 (severe): Secondary | ICD-10-CM

## 2019-09-17 DIAGNOSIS — J1282 Pneumonia due to coronavirus disease 2019: Secondary | ICD-10-CM

## 2019-09-17 DIAGNOSIS — U071 COVID-19: Principal | ICD-10-CM

## 2019-09-17 DIAGNOSIS — I10 Essential (primary) hypertension: Secondary | ICD-10-CM

## 2019-09-17 DIAGNOSIS — J9600 Acute respiratory failure, unspecified whether with hypoxia or hypercapnia: Secondary | ICD-10-CM

## 2019-09-17 LAB — CBC WITH DIFFERENTIAL/PLATELET
Abs Immature Granulocytes: 0.06 10*3/uL (ref 0.00–0.07)
Basophils Absolute: 0 10*3/uL (ref 0.0–0.1)
Basophils Relative: 0 %
Eosinophils Absolute: 0 10*3/uL (ref 0.0–0.5)
Eosinophils Relative: 0 %
HCT: 47.4 % (ref 39.0–52.0)
Hemoglobin: 15.3 g/dL (ref 13.0–17.0)
Immature Granulocytes: 1 %
Lymphocytes Relative: 6 %
Lymphs Abs: 0.5 10*3/uL — ABNORMAL LOW (ref 0.7–4.0)
MCH: 32.1 pg (ref 26.0–34.0)
MCHC: 32.3 g/dL (ref 30.0–36.0)
MCV: 99.4 fL (ref 80.0–100.0)
Monocytes Absolute: 0.3 10*3/uL (ref 0.1–1.0)
Monocytes Relative: 4 %
Neutro Abs: 7.1 10*3/uL (ref 1.7–7.7)
Neutrophils Relative %: 89 %
Platelets: 118 10*3/uL — ABNORMAL LOW (ref 150–400)
RBC: 4.77 MIL/uL (ref 4.22–5.81)
RDW: 14 % (ref 11.5–15.5)
WBC: 7.9 10*3/uL (ref 4.0–10.5)
nRBC: 0 % (ref 0.0–0.2)

## 2019-09-17 LAB — COMPREHENSIVE METABOLIC PANEL
ALT: 52 U/L — ABNORMAL HIGH (ref 0–44)
AST: 77 U/L — ABNORMAL HIGH (ref 15–41)
Albumin: 3.3 g/dL — ABNORMAL LOW (ref 3.5–5.0)
Alkaline Phosphatase: 36 U/L — ABNORMAL LOW (ref 38–126)
Anion gap: 10 (ref 5–15)
BUN: 62 mg/dL — ABNORMAL HIGH (ref 8–23)
CO2: 20 mmol/L — ABNORMAL LOW (ref 22–32)
Calcium: 8.4 mg/dL — ABNORMAL LOW (ref 8.9–10.3)
Chloride: 107 mmol/L (ref 98–111)
Creatinine, Ser: 2.25 mg/dL — ABNORMAL HIGH (ref 0.61–1.24)
GFR calc Af Amer: 31 mL/min — ABNORMAL LOW (ref >60)
GFR calc non Af Amer: 27 mL/min — ABNORMAL LOW (ref >60)
Glucose, Bld: 401 mg/dL — ABNORMAL HIGH (ref 70–99)
Potassium: 4.7 mmol/L (ref 3.5–5.1)
Sodium: 137 mmol/L (ref 135–145)
Total Bilirubin: 0.7 mg/dL (ref 0.3–1.2)
Total Protein: 7.1 g/dL (ref 6.5–8.1)

## 2019-09-17 LAB — TSH: TSH: 0.422 u[IU]/mL (ref 0.350–4.500)

## 2019-09-17 LAB — GLUCOSE, CAPILLARY
Glucose-Capillary: 367 mg/dL — ABNORMAL HIGH (ref 70–99)
Glucose-Capillary: 374 mg/dL — ABNORMAL HIGH (ref 70–99)
Glucose-Capillary: 487 mg/dL — ABNORMAL HIGH (ref 70–99)
Glucose-Capillary: 509 mg/dL (ref 70–99)
Glucose-Capillary: 431 mg/dL — ABNORMAL HIGH (ref 70–99)

## 2019-09-17 LAB — C-REACTIVE PROTEIN: CRP: 12.4 mg/dL — ABNORMAL HIGH (ref <1.0)

## 2019-09-17 LAB — MRSA PCR SCREENING: MRSA by PCR: NEGATIVE

## 2019-09-17 LAB — MAGNESIUM: Magnesium: 1.9 mg/dL (ref 1.7–2.4)

## 2019-09-17 LAB — FERRITIN: Ferritin: 832 ng/mL — ABNORMAL HIGH (ref 24–336)

## 2019-09-17 LAB — HEMOGLOBIN A1C
Hgb A1c MFr Bld: 8.3 % — ABNORMAL HIGH (ref 4.8–5.6)
Mean Plasma Glucose: 191.51 mg/dL

## 2019-09-17 LAB — PROCALCITONIN: Procalcitonin: 0.33 ng/mL

## 2019-09-17 LAB — D-DIMER, QUANTITATIVE: D-Dimer, Quant: 1.99 ug/mL-FEU — ABNORMAL HIGH (ref 0.00–0.50)

## 2019-09-17 LAB — BRAIN NATRIURETIC PEPTIDE: B Natriuretic Peptide: 35.3 pg/mL (ref 0.0–100.0)

## 2019-09-17 MED ORDER — INSULIN ASPART 100 UNIT/ML ~~LOC~~ SOLN: 8.0000 [IU] | Freq: Three times a day (TID) | SUBCUTANEOUS | Status: DC

## 2019-09-17 MED ORDER — INSULIN ASPART 100 UNIT/ML ~~LOC~~ SOLN
0.0000 [IU] | Freq: Three times a day (TID) | SUBCUTANEOUS | Status: DC
  Administered 2019-09-17: 16:00:00 25 [IU] via SUBCUTANEOUS
  Administered 2019-09-17 (×2): 20 [IU] via SUBCUTANEOUS
  Administered 2019-09-18: 12:00:00 14 [IU] via SUBCUTANEOUS
  Administered 2019-09-18: 15 [IU] via SUBCUTANEOUS

## 2019-09-17 MED ORDER — INSULIN ASPART 100 UNIT/ML ~~LOC~~ SOLN
4.0000 [IU] | Freq: Three times a day (TID) | SUBCUTANEOUS | Status: DC
  Administered 2019-09-17 (×3): 4 [IU] via SUBCUTANEOUS

## 2019-09-17 MED ORDER — FAMOTIDINE 20 MG PO TABS
20.0000 mg | ORAL_TABLET | Freq: Every day | ORAL | Status: DC
  Administered 2019-09-17 – 2019-09-20 (×4): 20 mg via ORAL
  Filled 2019-09-17 (×4): qty 1

## 2019-09-17 MED ORDER — TOCILIZUMAB 400 MG/20ML IV SOLN
800.0000 mg | Freq: Once | INTRAVENOUS | Status: AC
  Administered 2019-09-17: 800 mg via INTRAVENOUS
  Filled 2019-09-17: qty 40

## 2019-09-17 MED ORDER — INSULIN ASPART 100 UNIT/ML ~~LOC~~ SOLN: 0.0000 [IU] | Freq: Three times a day (TID) | SUBCUTANEOUS | Status: DC

## 2019-09-17 MED ORDER — HYDROCOD POLST-CPM POLST ER 10-8 MG/5ML PO SUER: 5.0000 mL | Freq: Two times a day (BID) | ORAL | Status: DC | PRN

## 2019-09-17 MED ORDER — INSULIN DETEMIR 100 UNIT/ML ~~LOC~~ SOLN
20.0000 [IU] | Freq: Two times a day (BID) | SUBCUTANEOUS | Status: DC
  Administered 2019-09-17 (×2): 20 [IU] via SUBCUTANEOUS
  Filled 2019-09-17 (×3): qty 0.2

## 2019-09-17 MED ORDER — INSULIN ASPART 100 UNIT/ML ~~LOC~~ SOLN: 0.0000 [IU] | Freq: Every day | SUBCUTANEOUS | Status: DC

## 2019-09-17 MED ORDER — BENZONATATE 100 MG PO CAPS
200.0000 mg | ORAL_CAPSULE | Freq: Three times a day (TID) | ORAL | Status: DC
  Administered 2019-09-17 – 2019-09-20 (×9): 200 mg via ORAL
  Filled 2019-09-17 (×9): qty 2

## 2019-09-17 NOTE — Progress Notes (Signed)
   09/17/19 0707  Family/Significant Other Communication  Family/Significant Other Update Called;Updated  Called updated family member per pt request. All questions asked and all questions answered.

## 2019-09-17 NOTE — Progress Notes (Signed)
Patient arrived from [facility] on [facility]; currently laying completely on side sating 90% on 14LHFNC; patient is safe with bed locked in low position.

## 2019-09-17 NOTE — Progress Notes (Addendum)
PROGRESS NOTE                                                                                                                                                                                                             Patient Demographics:    Jonathan Roman, is a 79 y.o. male, DOB - Mar 19, 1941, [id number]  Outpatient Primary MD for the patient is [name], MD   Admit date - 09/02/2019   LOS - 1  No chief complaint on file.      Brief Narrative: Patient is a 79 y.o. male with PMHx of HTN, HLD, gout-was Covid positive on 1/15-who presented to [facility] ED with progressive shortness of breath-found to have acute hypoxic respiratory failure secondary to COVID-19 pneumonia.   Subjective:    Jonathan Roman today remains comfortable at rest-he is on 15 L of HFNC.   Assessment  & Plan :   Acute Hypoxic Resp Failure due to Covid 19 Viral pneumonia: Still very hypoxic-does not feel that he is any better-continue steroids/remdesivir.  Rationale/risks/benefits of Actemra discussed-he consents.  Watch closely-if he does develop signs of respiratory distress-will need to be moved to the ICU.  The rationale for the off label use of Actemra its known side effects, potential benefits was  discussed with patient .The use of Actemra is based on published clinical articles/anecdotal data as several randomized trials have been negative, but lately there have been a few positive randomized trials as well.  There is no history of tuberculosis, hepatitis B or C.  Complete risks and long-term side effects are unknown, however in the best clinical judgment it is felt that the clinical benefit at this time outweighs medical risks given tenuous clinical state of the patient.  Patient agrees with the treatment plan and consent to the use of Actemra.    Fever: afebrile  O2 requirements:  SpO2: 92 % O2 Flow Rate (L/min): (S) 12 L/min   COVID-19  Labs: Recent Labs    08/25/2019 1154  DDIMER 2.27*  FERRITIN 707*  LDH 547*  CRP 12.5*    No results found for: BNP  Recent Labs  Lab 08/23/2019 1154  PROCALCITON 0.41    Lab Results  Component Value Date   SARSCOV2NAA POSITIVE (A) 09/09/2019     COVID-19 Medications: Steroids: 1/25>> Remdesivir: 1/25>> Actemra: 1/26 x 1  Other medications: Diuretics:Euvolemic-but will try Lasix 40 mg IV x1 given severity of hypoxemia. Antibiotics: Cefepime/Flagyl: 1/25 x 1 at ED.  Prone/Incentive Spirometry: encouraged patient to lie prone for 3-4 hours at a time for a total of 16 hours a day, and to encourage incentive spirometry use 3-4/hour.  DVT Prophylaxis  : Intermediate dose heparin  Mild AKI superimposed on CKD stage IV: AKI likely hemodynamically mediated-improved-follow electrolytes.  DM-2 with uncontrolled hyperglycemia secondary to steroids: Increase Levemir to 20 units twice daily, add 4 units of NovoLog with meals-change SSI to resistant scale  Recent Labs    09/03/2019 1644 09/11/2019 2146 09/17/19 0705  GLUCAP 361* 450* 374*   HTN: Controlled-continue metoprolol-lisinopril remains on hold.  Gout: No flare-continue allopurinol  Obesity: Estimated body mass index is 41.61 kg/m as calculated from the following:   Height as of this encounter: 5\' 10"  (1.778 m).   Weight as of this encounter: 131.5 kg.    Consults  :  None  Procedures  :  None  ABG: No results found for: PHART, PCO2ART, PO2ART, HCO3, TCO2, ACIDBASEDEF, O2SAT  Vent Settings: N/A  Condition - Extremely Guarded  Family Communication  : Left a voicemail for daughter  Code Status :  Full Code for now-but will need to continue to engage patient over the next few days-especially if his hypoxemia worsens.  Diet :  Diet Order            Diet heart healthy/carb modified Room service appropriate? Yes; Fluid consistency: Thin  Diet effective now               Disposition Plan  :  Remain  hospitalized-not stable for discharge.  Disposition will depend on hospital course  Barriers to discharge: Hypoxia requiring O2 supplementation/complete 5 days of IV Remdesivir  Antimicorbials  :    Anti-infectives (From admission, onward)   Start     Dose/Rate Route Frequency Ordered Stop   09/17/19 1000  remdesivir 100 mg in sodium chloride 0.9 % 100 mL IVPB  Status:  Discontinued     100 mg 200 mL/hr over 30 Minutes Intravenous Daily 09/08/2019 1618 08/30/2019 1621   09/17/19 1000  remdesivir 100 mg in sodium chloride 0.9 % 100 mL IVPB     100 mg 200 mL/hr over 30 Minutes Intravenous Daily 09/05/2019 1621 09/21/19 0959   09/11/2019 1800  remdesivir 200 mg in sodium chloride 0.9% 250 mL IVPB     200 mg 580 mL/hr over 30 Minutes Intravenous Once 09/07/2019 1621 09/21/2019 2234   09/22/2019 1618  remdesivir 200 mg in sodium chloride 0.9% 250 mL IVPB  Status:  Discontinued     200 mg 580 mL/hr over 30 Minutes Intravenous Once 08/24/2019 1618 09/12/2019 1621   09/13/2019 1245  vancomycin (VANCOCIN) 2,500 mg in sodium chloride 0.9 % 500 mL IVPB     2,500 mg 250 mL/hr over 120 Minutes Intravenous STAT 09/05/2019 1230 09/11/2019 1738   09/22/2019 1230  ceFEPIme (MAXIPIME) 2 g in sodium chloride 0.9 % 100 mL IVPB     2 g 200 mL/hr over 30 Minutes Intravenous  Once 09/22/2019 1220 09/22/2019 1321   08/31/2019 1230  metroNIDAZOLE (FLAGYL) IVPB 500 mg     500 mg 100 mL/hr over 60 Minutes Intravenous  Once 09/03/2019 1220 08/24/2019 1321   08/23/2019 1230  vancomycin (VANCOCIN) IVPB 1000 mg/200 mL premix  Status:  Discontinued     1,000 mg 200 mL/hr over 60 Minutes Intravenous  Once 08/25/2019 1220 09/11/2019  1230      Inpatient Medications  Scheduled Meds: . allopurinol  100 mg Oral Daily  . vitamin C  500 mg Oral Daily  . aspirin EC  81 mg Oral Daily  . heparin injection (subcutaneous)  7,500 Units Subcutaneous Q8H  . insulin aspart  0-15 Units Subcutaneous TID WC  . insulin aspart  0-15 Units Subcutaneous TID WC  .  insulin aspart  0-5 Units Subcutaneous QHS  . insulin aspart  0-5 Units Subcutaneous QHS  . insulin detemir  12 Units Subcutaneous BID  . Ipratropium-Albuterol  1 puff Inhalation Q6H  . linagliptin  5 mg Oral Daily  . methylPREDNISolone (SOLU-MEDROL) injection  0.5 mg/kg Intravenous Q12H  . metoprolol tartrate  12.5 mg Oral BID  . nortriptyline  10 mg Oral QHS  . zinc sulfate  220 mg Oral Daily   Continuous Infusions: . sodium chloride Stopped (09/17/19 0215)  . remdesivir 100 mg in NS 100 mL     PRN Meds:.acetaminophen **OR** acetaminophen, guaiFENesin-dextromethorphan, hydrALAZINE, ondansetron **OR** ondansetron (ZOFRAN) IV, oxyCODONE-acetaminophen, polyethylene glycol   Time Spent in minutes  35   See all Orders from today for further details   Oren Binet M.D on 09/17/2019 at 7:32 AM  To page go to [email or web address] - use universal password  [facility] -  Office  [phone]    Objective:   Vitals:   09/17/19 0230 09/17/19 0238 09/17/19 0350 09/17/19 0400  BP: 124/67   118/80  Pulse: 84 83 76 74  Resp: (!) 22 19 (!) 21 20  Temp: 97.9 F (36.6 C)   97.9 F (36.6 C)  TempSrc: Oral   Oral  SpO2: (!) 86% 90% 92% 92%  Weight: 131.5 kg     Height: 5\' 10"  (1.778 m)       Wt Readings from Last 3 Encounters:  09/17/19 131.5 kg     Intake/Output Summary (Last 24 hours) at 09/17/2019 0732 Last data filed at 09/17/2019 0600 Gross per 24 hour  Intake 1154.5 ml  Output 225 ml  Net 929.5 ml     Physical Exam Gen Exam:Alert awake-not in any distress HEENT:atraumatic, normocephalic Chest: B/L clear to auscultation anteriorly CVS:S1S2 regular Abdomen:soft non tender, non distended Extremities:trace edema Neurology: Non focal Skin: no rash   Data Review:    CBC Recent Labs  Lab 08/28/2019 1154 09/17/19 0534  WBC 6.7 7.9  HGB 16.7 15.3  HCT 50.7 47.4  PLT 111* 118*  MCV 100.2* 99.4  MCH 33.0 32.1  MCHC 32.9 32.3  RDW 14.1 14.0  LYMPHSABS 0.5*  0.5*  MONOABS 0.7 0.3  EOSABS 0.0 0.0  BASOSABS 0.0 0.0    Chemistries  Recent Labs  Lab 08/28/2019 1154  NA 134*  K 4.8  CL 101  CO2 17*  GLUCOSE 405*  BUN 55*  CREATININE 2.67*  CALCIUM 8.9  AST 91*  ALT 57*  ALKPHOS 38  BILITOT 1.4*   ------------------------------------------------------------------------------------------------------------------ Recent Labs    09/19/2019 1154  TRIG 272*    Lab Results  Component Value Date   HGBA1C 6.3 (H) 04/22/2011   ------------------------------------------------------------------------------------------------------------------ No results for input(s): TSH, T4TOTAL, T3FREE, THYROIDAB in the last 72 hours.  Invalid input(s): FREET3 ------------------------------------------------------------------------------------------------------------------ Recent Labs    09/06/2019 1154  FERRITIN 707*    Coagulation profile No results for input(s): INR, PROTIME in the last 168 hours.  Recent Labs    09/15/2019 1154  DDIMER 2.27*    Cardiac Enzymes No results for input(s): CKMB, TROPONINI, MYOGLOBIN in  the last 168 hours.  Invalid input(s): CK ------------------------------------------------------------------------------------------------------------------ No results found for: BNP  Micro Results Recent Results (from the past 240 hour(s))  Respiratory Panel by RT PCR (Flu A&B, Covid) - Nasopharyngeal Swab     Status: Abnormal   Collection Time: 08/23/2019 11:57 AM   Specimen: Nasopharyngeal Swab  Result Value Ref Range Status   SARS Coronavirus 2 by RT PCR POSITIVE (A) NEGATIVE Final    Comment: RESULT CALLED TO, READ BACK BY AND VERIFIED WITH: P.DOWD AT 1606 ON 09/03/2019 BY N.THOMPSON (NOTE) SARS-CoV-2 target nucleic acids are DETECTED. SARS-CoV-2 RNA is generally detectable in upper respiratory specimens  during the acute phase of infection. Positive results are indicative of the presence of the identified virus, but do not  rule out bacterial infection or co-infection with other pathogens not detected by the test. Clinical correlation with patient history and other diagnostic information is necessary to determine patient infection status. The expected result is Negative. Fact Sheet for Patients:  PinkCheek.be Fact Sheet for Healthcare Providers: GravelBags.it This test is not yet approved or cleared by the [place] FDA and  has been authorized for detection and/or diagnosis of SARS-CoV-2 by FDA under an Emergency Use Authorization (EUA).  This EUA will remain in effect (meaning this test can be use d) for the duration of  the COVID-19 declaration under Section 564(b)(1) of the Act, 21 U.S.C. section 360bbb-3(b)(1), unless the authorization is terminated or revoked sooner.    Influenza A by PCR NEGATIVE NEGATIVE Final   Influenza B by PCR NEGATIVE NEGATIVE Final    Comment: (NOTE) The Xpert Xpress SARS-CoV-2/FLU/RSV assay is intended as an aid in  the diagnosis of influenza from Nasopharyngeal swab specimens and  should not be used as a sole basis for treatment. Nasal washings and  aspirates are unacceptable for Xpert Xpress SARS-CoV-2/FLU/RSV  testing. Fact Sheet for Patients: PinkCheek.be Fact Sheet for Healthcare Providers: GravelBags.it This test is not yet approved or cleared by the [place] FDA and  has been authorized for detection and/or diagnosis of SARS-CoV-2 by  FDA under an Emergency Use Authorization (EUA). This EUA will remain  in effect (meaning this test can be used) for the duration of the  Covid-19 declaration under Section 564(b)(1) of the Act, 21  U.S.C. section 360bbb-3(b)(1), unless the authorization is  terminated or revoked. Performed at [facility], [place] [address] [place], [place] [address]     Radiology Reports DG Chest Cando 1  View  Result Date: 09/15/2019 CLINICAL DATA:  Shortness of breath, hypoxia EXAM: PORTABLE CHEST 1 VIEW COMPARISON:  04/13/2011 FINDINGS: Prior CABG. Heart size is stable prior. Lung volumes are low. Increased interstitial markings within the perihilar and bibasilar regions, left greater than right. No significant pleural fluid collection. No pneumothorax. IMPRESSION: Increased interstitial markings within the perihilar and bibasilar regions, left greater than right. Findings could reflect edema versus atypical/viral infection. Electronically Signed   By: Davina Poke D.O.   On: 08/27/2019 12:35

## 2019-09-17 NOTE — Plan of Care (Signed)
Went over plan of care with daughter and pt. All questions asked and all questions answered.

## 2019-09-17 NOTE — Plan of Care (Signed)
  Problem: Education: Goal: Knowledge of risk factors and measures for prevention of condition will improve Outcome: Progressing   Problem: Coping: Goal: Psychosocial and spiritual needs will be supported Outcome: Progressing   Problem: Respiratory: Goal: Will maintain a patent airway Outcome: Progressing Goal: Complications related to the disease process, condition or treatment will be avoided or minimized Outcome: Progressing   

## 2019-09-17 NOTE — Progress Notes (Signed)
Occupational Therapy Evaluation Patient Details Name: Jonathan Roman MRN: [id number] DOB: [birth year] Today's Date: 09/17/2019    History of Present Illness Pt is an 79 y.o. male who reports recent COVID-75 dx (09/06/19), now admitted 09/06/2019 with progressive SOB, found to be hypoxic. PMH includes HTN, gout, CAD, DM, tobacco abuse, L hand squamous cell carcinoma.   Clinical Impression   PTA pt lived with his children, independent in ADLs and mobility. Pt reports that children complete IADLs for him. Pt ambulates short household distances with occasional use of a cane. Pt does not use oxygen at home and is currently on 16L HFNC. Pt currently independent to min assist for self-care and functional transfer tasks. Pt tolerated sitting edge of bed ~20 min while engaging in therapy tasks. Pt tolerated standing 1 x 1 min and 1 x 30 sec with min guard. Noted 0 instances of loss of balance, however pt unsteady on feet. SpO2 decreased to 76% on 16L HFNC during activity. Pt required ~3-4 min seated rest break to return to 89%. Pt placed in semi-prone position following session per RN & MD request. SpO2 94% while prone in bed. Pt demonstrates decreased strength, endurance, balance, standing tolerance, and activity tolerance impacting ability to complete self-care and functional transfer tasks. Recommend skilled OT services to address above deficits in order to promote function and prevent further decline. Recommend [facility] OT for continued therapy following hospital discharge.     Follow Up Recommendations  Home health OT;Supervision - Intermittent    Equipment Recommendations  None recommended by OT    Recommendations for Other Services       Precautions / Restrictions Precautions Precautions: Fall;Other (comment) Precaution Comments: 16L HFNC Restrictions Weight Bearing Restrictions: No      Mobility Bed Mobility Overal bed mobility: Needs Assistance Bed Mobility: Supine to Sit;Rolling Rolling: Min  assist   Supine to sit: Modified independent (Device/Increase time)     General bed mobility comments: Min assist to roll onto stomach.  Transfers Overall transfer level: Needs assistance Equipment used: 1 person hand held assist Transfers: Sit to/from Stand Sit to Stand: Min guard         General transfer comment: Min guard to ensure balance and safety    Balance Overall balance assessment: Needs assistance   Sitting balance-Leahy Scale: Fair       Standing balance-Leahy Scale: Fair Standing balance comment: Reliant on UE support for dynamic stability                           ADL either performed or assessed with clinical judgement   ADL Overall ADL's : Needs assistance/impaired Eating/Feeding: Independent;Sitting   Grooming: Supervision/safety;Set up;Sitting   Upper Body Bathing: Set up;Supervision/ safety;Sitting   Lower Body Bathing: Minimal assistance;Sit to/from stand;Sitting/lateral leans   Upper Body Dressing : Set up;Supervision/safety;Sitting   Lower Body Dressing: Minimal assistance;Sit to/from stand;Sitting/lateral leans   Toilet Transfer: Min guard;Minimal assistance;BSC   Toileting- Clothing Manipulation and Hygiene: Minimal assistance;Sit to/from stand;Sitting/lateral lean       Functional mobility during ADLs: Min guard;Minimal assistance       Vision Baseline Vision/History: Wears glasses Wears Glasses: At all times       Perception     Praxis      Pertinent Vitals/Pain Pain Assessment: No/denies pain Faces Pain Scale: Hurts a little bit Pain Location: Generalized Pain Descriptors / Indicators: Discomfort Pain Intervention(s): Monitored during session     Hand Dominance Right  Extremity/Trunk Assessment Upper Extremity Assessment Upper Extremity Assessment: Overall WFL for tasks assessed   Lower Extremity Assessment Lower Extremity Assessment: Defer to PT evaluation       Communication  Communication Communication: HOH   Cognition Arousal/Alertness: Awake/alert Behavior During Therapy: WFL for tasks assessed/performed Overall Cognitive Status: Within Functional Limits for tasks assessed                                 General Comments: WFL for simple tasks, not formally assessed   General Comments  Pt on 16L HFNC with SpO2 decreasing to 76% during activity. Pt semi-prone at end of session with SpO2 94%.     Exercises Exercises: Other exercises Other Exercises Other Exercises: Encouraged pursed lip breathing throughout   Shoulder Instructions      Home Living Family/patient expects to be discharged to:: Private residence Living Arrangements: Children Available Help at Discharge: Family;Available 24 hours/day Type of Home: House Home Access: Stairs to enter [employer] of Steps: 4   Home Layout: One level     Bathroom Shower/Tub: Walk-in shower         Home Equipment: Grab bars - tub/shower;Walker - 2 wheels;Cane - single point;Shower seat   Additional Comments: Lives with son, daughter and dogs. Wife passed away 4 yrs ago      Prior Functioning/Environment Level of Independence: Independent        Comments: Intermittent use of SPC "if feeling sick," otherwise independent without DME. Reports primarily sedentary, only walks household distances. Children do shopping and household tasks. Wears glasses. CPAP at night. 1x fall in past 6 months slipping on slick wood flooring without shoes on. Pt independent with ADLs with family completing IADL. Pt has a license but doesn't drive.        OT Problem List: Decreased strength;Decreased activity tolerance;Impaired balance (sitting and/or standing);Cardiopulmonary status limiting activity      OT Treatment/Interventions: Self-care/ADL training;Therapeutic exercise;Neuromuscular education;Energy conservation;DME and/or AE instruction;Therapeutic activities;Patient/family  education;Balance training    OT Goals(Current goals can be found in the care plan section) Acute Rehab OT Goals Patient Stated Goal: to go home Time For Goal Achievement: 10/01/19 Potential to Achieve Goals: Good ADL Goals Pt Will Perform Grooming: with modified independence;standing Pt Will Perform Lower Body Bathing: with modified independence;sit to/from stand Pt Will Perform Lower Body Dressing: with modified independence;sit to/from stand Pt Will Transfer to Toilet: with modified independence;ambulating;regular height toilet Pt Will Perform Toileting - Clothing Manipulation and hygiene: with modified independence;sit to/from stand Additional ADL Goal #1: Pt to recall and verbalize 3 energy conservation strategies with 0 verbal cues. Additional ADL Goal #2: Pt to tolerate standing up to 10 min with modified independence, in preparation for ADLs. Additional ADL Goal #3: Pt to complete ADLs with SpO2 maintaining above 90%.  OT Frequency: Min 3X/week   Barriers to D/C:            Co-evaluation PT/OT/SLP Co-Evaluation/Treatment: Yes Reason for Co-Treatment: For patient/therapist safety;To address functional/ADL transfers(Decreased activity tolerance, Hypoxic) PT goals addressed during session: Mobility/safety with mobility OT goals addressed during session: ADL's and self-care      AM-PAC OT "6 Clicks" Daily Activity     Outcome Measure Help from another person eating meals?: None Help from another person taking care of personal grooming?: A Little Help from another person toileting, which includes using toliet, bedpan, or urinal?: A Little Help from another person bathing (including washing, rinsing, drying)?:  A Little Help from another person to put on and taking off regular upper body clothing?: A Little Help from another person to put on and taking off regular lower body clothing?: A Little 6 Click Score: 19   End of Session Equipment Utilized During Treatment:  Oxygen Nurse Communication: Mobility status  Activity Tolerance: Patient limited by fatigue(Limited by SOB) Patient left: in bed;with call bell/phone within reach  OT Visit Diagnosis: Unsteadiness on feet (R26.81);Muscle weakness (generalized) (M62.81)                Time: [id number] OT Time Calculation (min): 40 min Charges:  OT General Charges $OT Visit: 1 Visit OT Evaluation $OT Eval Moderate Complexity: 1 [address] OTR/L [phone]   Mauri Brooklyn 09/17/2019, 2:07 PM

## 2019-09-17 NOTE — Progress Notes (Addendum)
Inpatient Diabetes Program Recommendations  AACE/ADA: New Consensus Statement on Inpatient Glycemic Control   Target Ranges:  Prepandial:   less than 140 mg/dL      Peak postprandial:   less than 180 mg/dL (1-2 hours)      Critically ill patients:  140 - 180 mg/dL   Results for ANANIAS, BLILEY (MRN [id number]) as of 09/17/2019 08:52  Ref. Range 08/27/2019 13:04 08/25/2019 16:44 08/30/2019 21:46 09/17/2019 07:05  Glucose-Capillary Latest Ref Range: 70 - 99 mg/dL 391 (H) 361 (H) 450 (H) 374 (H)   Review of Glycemic Control  Diabetes history: DM2 Outpatient Diabetes medications: NPH 20 units QHS, Regular 14 units TID with meals Current orders for Inpatient glycemic control: Levemir 20 units BID, Novolog 4 units TID with meals, Novolog 0-20 units TID with meals, Novolog 0-5 units QHS, Tradjenta 5 mg daily; Solumedrol 65.625 mg Q12H  Inpatient Diabetes Program Recommendations:   Insulin - Basal: Noted Levemir changed to 20 units BID today.  Insulin - Meal Coverage: If steroids are continued, please consider increasing meal coverage to Novolog 10 units TID with meals if patient eats at least 50% of meals.  Thanks, Barnie Alderman, RN, MSN, CDE Diabetes Coordinator Inpatient Diabetes Program [phone] (Team Pager from 8am to 5pm)

## 2019-09-17 NOTE — Evaluation (Signed)
Physical Therapy Evaluation Patient Details Name: Jonathan Roman MRN: [id number] DOB: [birth year] Today's Date: 09/17/2019   History of Present Illness  Pt is an 79 y.o. male who reports recent COVID-73 dx (09/06/19), now admitted 09/12/2019 with progressive SOB, found to be hypoxic. PMH includes HTN, gout, CAD, DM, tobacco abuse, L hand squamous cell carcinoma.    Clinical Impression  Pt presents with an overall decrease in functional mobility secondary to above. PTA, pt independent household ambulator, primarily sedentary; lives with children who assist with household tasks and driving. Today, pt able to initiate transfer and gait training with minA; limited by generalized weakness and decreased activity tolerance; SpO2 down to 76% on 16L HFNC with minimal activity, returning to >/88% with seated rest and pursed lip/deep breathing. Pt left in 3/4 prone position, tolerating well with SpO2 maintaining 94% (RN aware). Pt would benefit from continued acute PT services to maximize functional mobility and independence prior to d/c with HHPT services.     Follow Up Recommendations Home health PT;Supervision for mobility/OOB    Equipment Recommendations  None recommended by PT    Recommendations for Other Services       Precautions / Restrictions Precautions Precautions: Fall;Other (comment) Precaution Comments: 16L HFNC Restrictions Weight Bearing Restrictions: No      Mobility  Bed Mobility Overal bed mobility: Needs Assistance Bed Mobility: Supine to Sit;Rolling Rolling: Min assist   Supine to sit: Modified independent (Device/Increase time)     General bed mobility comments: Able to roll onto side without assist, increased time due to SOB; minA for rolling onto stomach and repositioning for comfort with pillows. Pt left in 3/4 prone position (prone per MD/RN request) with bed flat in trendelenberg (feet down), tolerating well with SpO2 maintaining 94% on 16L HFNC  Transfers Overall  transfer level: Needs assistance Equipment used: None Transfers: Sit to/from Stand Sit to Stand: Min guard         General transfer comment: Performed 2x sit<>stands from EOB, close min guard for balance, but no physical assist; increased time and effort  Ambulation/Gait Ambulation/Gait assistance: Min assist   Assistive device: 1 person hand held assist       General Gait Details: Performed marching in place standing at EOB, reliant on minA for HHA to maintain balance, required seated rest break after ~10 sec; SpO2 down to 76% on 16 L HFNC  Stairs            Wheelchair Mobility    Modified Rankin (Stroke Patients Only)       Balance Overall balance assessment: Needs assistance   Sitting balance-Leahy Scale: Fair       Standing balance-Leahy Scale: Fair Standing balance comment: Reliant on UE support for dynamic stability                             Pertinent Vitals/Pain Pain Assessment: Faces Faces Pain Scale: Hurts a little bit Pain Location: Generalized Pain Descriptors / Indicators: Discomfort Pain Intervention(s): Monitored during session    Home Living Family/patient expects to be discharged to:: Private residence Living Arrangements: Children Available Help at Discharge: Family;Available 24 hours/day Type of Home: House Home Access: Stairs to enter   [employer] of Steps: 4 Home Layout: One level Home Equipment: Grab bars - tub/shower;Walker - 2 wheels;Cane - single point;Shower seat Additional Comments: Lives with son, daughter and dogs. Wife passed away 4 yrs ago    Prior Function Level of Independence:  Independent         Comments: Intermittent use of SPC "if feeling sick," otherwise independent without DME. Reports primarily sedentary, only walks household distances. Children do shopping and household tasks. Wears glasses. CPAP at night. 1x fall in past 6 months slipping on slick wood flooring without shoes on      Hand Dominance   Dominant Hand: Right    Extremity/Trunk Assessment   Upper Extremity Assessment Upper Extremity Assessment: Overall WFL for tasks assessed    Lower Extremity Assessment Lower Extremity Assessment: Generalized weakness       Communication   Communication: HOH  Cognition Arousal/Alertness: Awake/alert Behavior During Therapy: WFL for tasks assessed/performed Overall Cognitive Status: Within Functional Limits for tasks assessed                                 General Comments: WFL for simple tasks, not formally assessed      General Comments      Exercises Other Exercises Other Exercises: Talked through multiple bouts of pursed lip/deep breathing as pt SOB and desaturates with minimal activity, recovers well with deep breathing   Assessment/Plan    PT Assessment Patient needs continued PT services  PT Problem List Decreased strength;Decreased activity tolerance;Decreased balance;Decreased mobility;Decreased knowledge of use of DME;Cardiopulmonary status limiting activity       PT Treatment Interventions DME instruction;Gait training;Stair training;Functional mobility training;Therapeutic activities;Therapeutic exercise;Balance training;Patient/family education    PT Goals (Current goals can be found in the Care Plan section)  Acute Rehab PT Goals Patient Stated Goal: Get back home PT Goal Formulation: With patient Time For Goal Achievement: 10/01/19 Potential to Achieve Goals: Good    Frequency Min 3X/week   Barriers to discharge        Co-evaluation PT/OT/SLP Co-Evaluation/Treatment: Yes Reason for Co-Treatment: For patient/therapist safety;To address functional/ADL transfers(decreased tolerance, hypoxic) PT goals addressed during session: Mobility/safety with mobility         AM-PAC PT "6 Clicks" Mobility  Outcome Measure Help needed turning from your back to your side while in a flat bed without using bedrails?:  None Help needed moving from lying on your back to sitting on the side of a flat bed without using bedrails?: None Help needed moving to and from a bed to a chair (including a wheelchair)?: A Little Help needed standing up from a chair using your arms (e.g., wheelchair or bedside chair)?: A Little Help needed to walk in hospital room?: A Little Help needed climbing 3-5 steps with a railing? : A Lot 6 Click Score: 19    End of Session Equipment Utilized During Treatment: Oxygen Activity Tolerance: Patient tolerated treatment well;Patient limited by fatigue Patient left: in bed;with call bell/phone within reach(prone) Nurse Communication: Mobility status PT Visit Diagnosis: Other abnormalities of gait and mobility (R26.89);Muscle weakness (generalized) (M62.81)    Time: [id number] PT Time Calculation (min) (ACUTE ONLY): 41 min   Charges:   PT Evaluation $PT Eval Moderate Complexity: 1 Mod PT Treatments $Therapeutic Activity: 8-22 mins   Mabeline Caras, PT, DPT Acute Rehabilitation Services  Pager [phone] Office [place] 09/17/2019, 12:14 PM

## 2019-09-18 ENCOUNTER — Inpatient Hospital Stay (HOSPITAL_COMMUNITY): Payer: No Typology Code available for payment source

## 2019-09-18 LAB — CBC WITH DIFFERENTIAL/PLATELET
Abs Immature Granulocytes: 0.14 10*3/uL — ABNORMAL HIGH (ref 0.00–0.07)
Basophils Absolute: 0 10*3/uL (ref 0.0–0.1)
Basophils Relative: 0 %
Eosinophils Absolute: 0 10*3/uL (ref 0.0–0.5)
Eosinophils Relative: 0 %
HCT: 48.7 % (ref 39.0–52.0)
Hemoglobin: 15.8 g/dL (ref 13.0–17.0)
Immature Granulocytes: 1 %
Lymphocytes Relative: 4 %
Lymphs Abs: 0.6 10*3/uL — ABNORMAL LOW (ref 0.7–4.0)
MCH: 32.2 pg (ref 26.0–34.0)
MCHC: 32.4 g/dL (ref 30.0–36.0)
MCV: 99.2 fL (ref 80.0–100.0)
Monocytes Absolute: 0.4 10*3/uL (ref 0.1–1.0)
Monocytes Relative: 3 %
Neutro Abs: 12.5 10*3/uL — ABNORMAL HIGH (ref 1.7–7.7)
Neutrophils Relative %: 92 %
Platelets: 149 10*3/uL — ABNORMAL LOW (ref 150–400)
RBC: 4.91 MIL/uL (ref 4.22–5.81)
RDW: 13.9 % (ref 11.5–15.5)
WBC: 13.7 10*3/uL — ABNORMAL HIGH (ref 4.0–10.5)
nRBC: 0 % (ref 0.0–0.2)

## 2019-09-18 LAB — COMPREHENSIVE METABOLIC PANEL
ALT: 51 U/L — ABNORMAL HIGH (ref 0–44)
AST: 78 U/L — ABNORMAL HIGH (ref 15–41)
Albumin: 3.4 g/dL — ABNORMAL LOW (ref 3.5–5.0)
Alkaline Phosphatase: 50 U/L (ref 38–126)
Anion gap: 15 (ref 5–15)
BUN: 76 mg/dL — ABNORMAL HIGH (ref 8–23)
CO2: 20 mmol/L — ABNORMAL LOW (ref 22–32)
Calcium: 9 mg/dL (ref 8.9–10.3)
Chloride: 103 mmol/L (ref 98–111)
Creatinine, Ser: 2.18 mg/dL — ABNORMAL HIGH (ref 0.61–1.24)
GFR calc Af Amer: 32 mL/min — ABNORMAL LOW (ref >60)
GFR calc non Af Amer: 28 mL/min — ABNORMAL LOW (ref >60)
Glucose, Bld: 369 mg/dL — ABNORMAL HIGH (ref 70–99)
Potassium: 4.6 mmol/L (ref 3.5–5.1)
Sodium: 138 mmol/L (ref 135–145)
Total Bilirubin: 0.8 mg/dL (ref 0.3–1.2)
Total Protein: 7.2 g/dL (ref 6.5–8.1)

## 2019-09-18 LAB — D-DIMER, QUANTITATIVE: D-Dimer, Quant: 2.12 ug/mL-FEU — ABNORMAL HIGH (ref 0.00–0.50)

## 2019-09-18 LAB — FERRITIN: Ferritin: 1028 ng/mL — ABNORMAL HIGH (ref 24–336)

## 2019-09-18 LAB — GLUCOSE, CAPILLARY
Glucose-Capillary: 359 mg/dL — ABNORMAL HIGH (ref 70–99)
Glucose-Capillary: 315 mg/dL — ABNORMAL HIGH (ref 70–99)
Glucose-Capillary: 439 mg/dL — ABNORMAL HIGH (ref 70–99)

## 2019-09-18 LAB — C-REACTIVE PROTEIN: CRP: 11.8 mg/dL — ABNORMAL HIGH (ref <1.0)

## 2019-09-18 LAB — PROCALCITONIN: Procalcitonin: 0.31 ng/mL

## 2019-09-18 MED ORDER — SODIUM CHLORIDE 0.9% FLUSH
10.0000 mL | Freq: Two times a day (BID) | INTRAVENOUS | Status: DC
  Administered 2019-09-18 – 2019-09-20 (×4): 10 mL

## 2019-09-18 MED ORDER — SODIUM CHLORIDE 0.9 % IV SOLN: INTRAVENOUS | Status: DC

## 2019-09-18 MED ORDER — DEXTROSE-NACL 5-0.45 % IV SOLN: INTRAVENOUS | Status: DC

## 2019-09-18 MED ORDER — SODIUM CHLORIDE 0.9% FLUSH: 10.0000 mL | INTRAVENOUS | Status: DC | PRN

## 2019-09-18 MED ORDER — FUROSEMIDE 10 MG/ML IJ SOLN
80.0000 mg | Freq: Once | INTRAMUSCULAR | Status: AC
  Administered 2019-09-18: 12:00:00 80 mg via INTRAVENOUS
  Filled 2019-09-18: qty 8

## 2019-09-18 MED ORDER — INSULIN ASPART 100 UNIT/ML ~~LOC~~ SOLN
14.0000 [IU] | Freq: Three times a day (TID) | SUBCUTANEOUS | Status: DC
  Administered 2019-09-18: 12:00:00 14 [IU] via SUBCUTANEOUS

## 2019-09-18 MED ORDER — INSULIN DETEMIR 100 UNIT/ML ~~LOC~~ SOLN
30.0000 [IU] | Freq: Two times a day (BID) | SUBCUTANEOUS | Status: DC
  Administered 2019-09-18: 10:00:00 30 [IU] via SUBCUTANEOUS
  Filled 2019-09-18 (×2): qty 0.3

## 2019-09-18 MED ORDER — DEXTROSE 50 % IV SOLN: 0.0000 mL | INTRAVENOUS | Status: DC | PRN

## 2019-09-18 MED ORDER — INSULIN REGULAR(HUMAN) IN NACL 100-0.9 UT/100ML-% IV SOLN
INTRAVENOUS | Status: DC
  Administered 2019-09-18: 6.5 [IU]/h via INTRAVENOUS
  Filled 2019-09-18 (×3): qty 100

## 2019-09-18 NOTE — Progress Notes (Signed)
Came back from break to find pt in room with two RNs, sitting up on side of bed. Pt had removed all monitoring devices, unhooked midline IV insulin drip, took off O2, and was trying to get out of bed. Bed alarm sounded. RNs state no fall. Blood noted to have come from midline, IV restarted, flushes with no difficulty, good blood return. Pt cleaned and placed in chair. With chair alarm and call light in reach. O2 palced back on. Reoriented to time and place, unable to determine if pt understands, when asked he makes jokes instead of answering questions clearly. Is now sitting up in chair, monitoring devices all on, vitals stable, no distress noted. Notifying charge that sitter is recommended

## 2019-09-18 NOTE — Progress Notes (Signed)
   09/18/19 0700  Family/Significant Other Communication  Family/Significant Other Update Called;Updated  Called and updated daughter and pt as requested by pt. All questions asked and all questions answered.

## 2019-09-18 NOTE — Progress Notes (Signed)
Transferred to room 161 via bed on 15L [place] and NRB, palced on 35L, 100% HFNC, sat 88%, no distress noted. No c/o of chest pain or SOB. All alarms on, call light in reach

## 2019-09-18 NOTE — Progress Notes (Signed)
Getting midline placed

## 2019-09-18 NOTE — Progress Notes (Addendum)
Patient placed on Heated HFNC 35L, 100% due to desats.

## 2019-09-18 NOTE — Progress Notes (Addendum)
PROGRESS NOTE                                                                                                                                                                                                             Patient Demographics:    Jonathan Roman, is a 79 y.o. male, DOB - 03/20/1941, [id number]  Outpatient Primary MD for the patient is [name], MD   Admit date - 09/02/2019   LOS - 2  No chief complaint on file.      Brief Narrative: Patient is a 79 y.o. male with PMHx of HTN, HLD, gout-was Covid positive on 1/15-who presented to [facility] ED with progressive shortness of breath-found to have acute hypoxic respiratory failure secondary to COVID-19 pneumonia.   Subjective:   Worse today-requiring both HFNC and NRB with O2 saturations only in the 80s.  He also feels short of breath with minimal movement.   Assessment  & Plan :   Acute Hypoxic Resp Failure due to Covid 19 Viral pneumonia: Worsened overnight-continue steroids/remdesivir.  Will switch him to heated high flow today.  Have had a long conversation with patient-explained continued deterioration in spite of maximal treatment-understands that this may not be a survivable illness-agrees that he would not want to be intubated if he continues to deteriorate.  Subsequently spoke with patient's daughter-explained above-she was understanding.  DNR order placed.     Fever: afebrile  O2 requirements:  SpO2: 90 % O2 Flow Rate (L/min): 35 L/min FiO2 (%): 100 %   COVID-19 Labs: Recent Labs    09/05/2019 1154 09/17/19 0534 09/18/19 0415  DDIMER 2.27* 1.99* 2.12*  FERRITIN 707* 832* 1,028*  LDH 547*  --   --   CRP 12.5* 12.4* 11.8*       Component Value Date/Time   BNP 35.3 09/17/2019 0534    Recent Labs  Lab 08/25/2019 1154 09/17/19 0534 09/18/19 0415  PROCALCITON 0.41 0.33 0.31    Lab Results  Component Value Date   SARSCOV2NAA POSITIVE (A)  08/25/2019     COVID-19 Medications: Steroids: 1/25>> Remdesivir: 1/25>> Actemra: 1/26 x 1  Other medications: Diuretics:Has trace lower extremity edema- few bibasilar rales-Lasix 60 mg IV x1  Antibiotics: Cefepime/Flagyl: 1/25 x 1 at ED.  Prone/Incentive Spirometry: encouraged patient to lie prone for 3-4 hours at a time for a total of  16 hours a day, and to encourage incentive spirometry use 3-4/hour.  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  DM-2 with uncontrolled hyperglycemia secondary to steroids: continues to have uncontrolled hyperglycemia-increase Levemir to 30 units twice daily, increase NovoLog to 14 units-continue resistant SSI.  Follow and optimize.   Addendum: CBGs remain uncontrolled-despite escalating doses of Levemir/NovoLog-starting IV insulin infusion  Recent Labs    09/17/19 1949 09/18/19 0732 09/18/19 1207  GLUCAP 359* 315* 439*   HTN: Controlled-continue metoprolol-lisinopril remains on hold.  Gout: No flare-continue allopurinol  Obesity: Estimated body mass index is 41.61 kg/m as calculated from the following:   Height as of this encounter: 5\' 10"  (1.778 m).   Weight as of this encounter: 131.5 kg.    Consults  :  None  Procedures  :  None  ABG: No results found for: PHART, PCO2ART, PO2ART, HCO3, TCO2, ACIDBASEDEF, O2SAT  Vent Settings: N/A  Condition - Extremely Guarded  Family Communication  : Daughter over the phone on 1/27.  Code Status : DNR.  Diet :  Diet Order            Diet heart healthy/carb modified Room service appropriate? Yes; Fluid consistency: Thin  Diet effective now               Disposition Plan  :  Remain hospitalized-not stable for discharge.  Disposition will depend on hospital course  Barriers to discharge: Hypoxia requiring O2 supplementation/complete 5 days of IV  Remdesivir  Antimicorbials  :    Anti-infectives (From admission, onward)   Start     Dose/Rate Route Frequency Ordered Stop   09/17/19 1000  remdesivir 100 mg in sodium chloride 0.9 % 100 mL IVPB  Status:  Discontinued     100 mg 200 mL/hr over 30 Minutes Intravenous Daily 08/27/2019 1618 08/30/2019 1621   09/17/19 1000  remdesivir 100 mg in sodium chloride 0.9 % 100 mL IVPB     100 mg 200 mL/hr over 30 Minutes Intravenous Daily 09/15/2019 1621 09/21/19 0959   09/03/2019 1800  remdesivir 200 mg in sodium chloride 0.9% 250 mL IVPB     200 mg 580 mL/hr over 30 Minutes Intravenous Once 09/04/2019 1621 09/06/2019 2234   09/14/2019 1618  remdesivir 200 mg in sodium chloride 0.9% 250 mL IVPB  Status:  Discontinued     200 mg 580 mL/hr over 30 Minutes Intravenous Once 08/28/2019 1618 09/17/2019 1621   09/13/2019 1245  vancomycin (VANCOCIN) 2,500 mg in sodium chloride 0.9 % 500 mL IVPB     2,500 mg 250 mL/hr over 120 Minutes Intravenous STAT 09/03/2019 1230 09/07/2019 1738   09/19/2019 1230  ceFEPIme (MAXIPIME) 2 g in sodium chloride 0.9 % 100 mL IVPB     2 g 200 mL/hr over 30 Minutes Intravenous  Once 09/14/2019 1220 08/28/2019 1321   09/08/2019 1230  metroNIDAZOLE (FLAGYL) IVPB 500 mg     500 mg 100 mL/hr over 60 Minutes Intravenous  Once 09/21/2019 1220 08/31/2019 1321   08/28/2019 1230  vancomycin (VANCOCIN) IVPB 1000 mg/200 mL premix  Status:  Discontinued     1,000 mg 200 mL/hr over 60 Minutes Intravenous  Once 08/26/2019 1220 09/06/2019 1230      Inpatient Medications  Scheduled Meds: . allopurinol  100 mg Oral Daily  . vitamin C  500 mg Oral Daily  . aspirin EC  81 mg Oral Daily  .  benzonatate  200 mg Oral TID  . famotidine  20 mg Oral Daily  . heparin injection (subcutaneous)  7,500 Units Subcutaneous Q8H  . insulin aspart  0-20 Units Subcutaneous TID WC  . insulin aspart  0-5 Units Subcutaneous QHS  . insulin aspart  14 Units Subcutaneous TID WC  . insulin detemir  30 Units Subcutaneous BID  .  Ipratropium-Albuterol  1 puff Inhalation Q6H  . linagliptin  5 mg Oral Daily  . methylPREDNISolone (SOLU-MEDROL) injection  0.5 mg/kg Intravenous Q12H  . metoprolol tartrate  12.5 mg Oral BID  . nortriptyline  10 mg Oral QHS  . zinc sulfate  220 mg Oral Daily   Continuous Infusions: . remdesivir 100 mg in NS 100 mL 100 mg (09/18/19 0950)   PRN Meds:.acetaminophen **OR** acetaminophen, chlorpheniramine-HYDROcodone, guaiFENesin-dextromethorphan, hydrALAZINE, ondansetron **OR** ondansetron (ZOFRAN) IV, oxyCODONE-acetaminophen, polyethylene glycol   Time Spent in minutes  55   The patient is critically ill with multiple organ system failure and requires high complexity decision making for assessment and support, frequent evaluation and titration of therapies, advanced monitoring, review of radiographic studies and interpretation of complex data.   See all Orders from today for further details   Oren Binet M.D on 09/18/2019 at 3:48 PM  To page go to [email or web address] - use universal password  [facility] -  Office  [phone]    Objective:   Vitals:   09/18/19 1430 09/18/19 1445 09/18/19 1500 09/18/19 1515  BP: 130/72     Pulse: 67 98 (!) 101 99  Resp: (!) 32  (!) 35 (!) 24  Temp:      TempSrc:      SpO2: (!) 85% 92% (!) 82% 90%  Weight:      Height:        Wt Readings from Last 3 Encounters:  09/17/19 131.5 kg     Intake/Output Summary (Last 24 hours) at 09/18/2019 1548 Last data filed at 09/18/2019 1500 Gross per 24 hour  Intake 1267.99 ml  Output 1550 ml  Net -282.01 ml     Physical Exam Gen Exam:Alert awake-not in any distress HEENT:atraumatic, normocephalic Chest: B/L clear to auscultation anteriorly-but few bibasilar rales CVS:S1S2 regular Abdomen:soft non tender, non distended Extremities:trace edema Neurology: Non focal Skin: no rash   Data Review:    CBC Recent Labs  Lab 09/15/2019 1154 09/17/19 0534 09/18/19 0415  WBC 6.7 7.9 13.7*   HGB 16.7 15.3 15.8  HCT 50.7 47.4 48.7  PLT 111* 118* 149*  MCV 100.2* 99.4 99.2  MCH 33.0 32.1 32.2  MCHC 32.9 32.3 32.4  RDW 14.1 14.0 13.9  LYMPHSABS 0.5* 0.5* 0.6*  MONOABS 0.7 0.3 0.4  EOSABS 0.0 0.0 0.0  BASOSABS 0.0 0.0 0.0    Chemistries  Recent Labs  Lab 08/29/2019 1154 09/17/19 0534 09/18/19 0415  NA 134* 137 138  K 4.8 4.7 4.6  CL 101 107 103  CO2 17* 20* 20*  GLUCOSE 405* 401* 369*  BUN 55* 62* 76*  CREATININE 2.67* 2.25* 2.18*  CALCIUM 8.9 8.4* 9.0  MG  --  1.9  --   AST 91* 77* 78*  ALT 57* 52* 51*  ALKPHOS 38 36* 50  BILITOT 1.4* 0.7 0.8   ------------------------------------------------------------------------------------------------------------------ Recent Labs    08/29/2019 1154  TRIG 272*    Lab Results  Component Value Date   HGBA1C 8.3 (H) 09/17/2019   ------------------------------------------------------------------------------------------------------------------ Recent Labs    09/17/19 0543  TSH 0.422   ------------------------------------------------------------------------------------------------------------------ Recent Labs  09/17/19 0534 09/18/19 0415  FERRITIN 832* 1,028*    Coagulation profile No results for input(s): INR, PROTIME in the last 168 hours.  Recent Labs    09/17/19 0534 09/18/19 0415  DDIMER 1.99* 2.12*    Cardiac Enzymes No results for input(s): CKMB, TROPONINI, MYOGLOBIN in the last 168 hours.  Invalid input(s): CK ------------------------------------------------------------------------------------------------------------------    Component Value Date/Time   BNP 35.3 09/17/2019 0534    Micro Results Recent Results (from the past 240 hour(s))  Blood Culture (routine x 2)     Status: None (Preliminary result)   Collection Time: 09/04/2019 11:54 AM   Specimen: BLOOD LEFT FOREARM  Result Value Ref Range Status   Specimen Description   Final    BLOOD LEFT FOREARM Performed at [facility] [address] [place], [place] [address]    Special Requests   Final    BOTTLES DRAWN AEROBIC AND ANAEROBIC Blood Culture adequate volume Performed at [facility] [address], [place], [place] [address]    Culture   Final    NO GROWTH 2 DAYS Performed at [facility] [address], [place], [place] [address]    Report Status PENDING  Incomplete  Respiratory Panel by RT PCR (Flu A&B, Covid) - Nasopharyngeal Swab     Status: Abnormal   Collection Time: 09/01/2019 11:57 AM   Specimen: Nasopharyngeal Swab  Result Value Ref Range Status   SARS Coronavirus 2 by RT PCR POSITIVE (A) NEGATIVE Final    Comment: RESULT CALLED TO, READ BACK BY AND VERIFIED WITH: P.DOWD AT 1606 ON 09/03/2019 BY N.THOMPSON (NOTE) SARS-CoV-2 target nucleic acids are DETECTED. SARS-CoV-2 RNA is generally detectable in upper respiratory specimens  during the acute phase of infection. Positive results are indicative of the presence of the identified virus, but do not rule out bacterial infection or co-infection with other pathogens not detected by the test. Clinical correlation with patient history and other diagnostic information is necessary to determine patient infection status. The expected result is Negative. Fact Sheet for Patients:  PinkCheek.be Fact Sheet for Healthcare Providers: GravelBags.it This test is not yet approved or cleared by the [place] FDA and  has been authorized for detection and/or diagnosis of SARS-CoV-2 by FDA under an Emergency Use Authorization (EUA).  This EUA will remain in effect (meaning this test can be use d) for the duration of  the COVID-19 declaration under Section 564(b)(1) of the Act, 21 U.S.C. section 360bbb-3(b)(1), unless the authorization is terminated or revoked sooner.    Influenza A by PCR NEGATIVE NEGATIVE Final   Influenza B by PCR NEGATIVE NEGATIVE  Final    Comment: (NOTE) The Xpert Xpress SARS-CoV-2/FLU/RSV assay is intended as an aid in  the diagnosis of influenza from Nasopharyngeal swab specimens and  should not be used as a sole basis for treatment. Nasal washings and  aspirates are unacceptable for Xpert Xpress SARS-CoV-2/FLU/RSV  testing. Fact Sheet for Patients: PinkCheek.be Fact Sheet for Healthcare Providers: GravelBags.it This test is not yet approved or cleared by the [place] FDA and  has been authorized for detection and/or diagnosis of SARS-CoV-2 by  FDA under an Emergency Use Authorization (EUA). This EUA will remain  in effect (meaning this test can be used) for the duration of the  Covid-19 declaration under Section 564(b)(1) of the Act, 21  U.S.C. section 360bbb-3(b)(1), unless the authorization is  terminated or revoked. Performed at [facility], [address]  [place], [place] [address]   Blood Culture (routine x 2)     Status: None (Preliminary result)   Collection Time: 09/12/2019 12:06 PM   Specimen: BLOOD  Result Value Ref Range Status   Specimen Description   Final    BLOOD RIGHT ANTECUBITAL Performed at [facility] [address], [place], [place] [address]    Special Requests   Final    BOTTLES DRAWN AEROBIC AND ANAEROBIC Blood Culture adequate volume Performed at [facility] [address], [place], [place] [address]    Culture   Final    NO GROWTH 2 DAYS Performed at [facility] [address] [place], [place] [address]    Report Status PENDING  Incomplete  MRSA PCR Screening     Status: None   Collection Time: 09/17/19  3:57 AM   Specimen: Nasopharyngeal  Result Value Ref Range Status   MRSA by PCR NEGATIVE NEGATIVE Final    Comment:        The GeneXpert MRSA Assay (FDA approved for NASAL specimens only), is one component of a comprehensive MRSA  colonization surveillance program. It is not intended to diagnose MRSA infection nor to guide or monitor treatment for MRSA infections. Performed at [facility], [place] [address], [place], [place] [address]     Radiology Reports DG Chest [place] 1 View  Result Date: 08/28/2019 CLINICAL DATA:  Shortness of breath, hypoxia EXAM: PORTABLE CHEST 1 VIEW COMPARISON:  04/13/2011 FINDINGS: Prior CABG. Heart size is stable prior. Lung volumes are low. Increased interstitial markings within the perihilar and bibasilar regions, left greater than right. No significant pleural fluid collection. No pneumothorax. IMPRESSION: Increased interstitial markings within the perihilar and bibasilar regions, left greater than right. Findings could reflect edema versus atypical/viral infection. Electronically Signed   By: Davina Poke D.O.   On: 08/24/2019 12:35   DG Chest Port 1V same Day  Result Date: 09/18/2019 CLINICAL DATA:  Shortness of breath. Respiratory failure due to COVID 19. EXAM: PORTABLE CHEST 1 VIEW COMPARISON:  Single-view of the chest 09/17/2019 and 09/11/2019. FINDINGS: Hazy bilateral airspace opacities persist without change. No pneumothorax or pleural effusion. Heart size is normal. No acute bony abnormality. IMPRESSION: No change in bilateral airspace disease consistent with pneumonia. Electronically Signed   By: Inge Rise M.D.   On: 09/18/2019 13:34   DG Chest Port 1V same Day  Result Date: 09/17/2019 CLINICAL DATA:  Hypoxia and respiratory failure secondary to COVID pneumonia. EXAM: PORTABLE CHEST 1 VIEW COMPARISON:  09/14/2019 FINDINGS: Stable heart size. Interstitial infiltrates predominantly in the lower lung zones bilaterally show some interval worsening since the prior chest x-ray. No overt airspace edema, pleural effusions or pneumothorax identified. IMPRESSION: Some interval worsening of bilateral pulmonary interstitial infiltrates. Electronically Signed   By:  Aletta Edouard M.D.   On: 09/17/2019 09:19

## 2019-09-19 LAB — COMPREHENSIVE METABOLIC PANEL
ALT: 49 U/L — ABNORMAL HIGH (ref 0–44)
AST: 66 U/L — ABNORMAL HIGH (ref 15–41)
Albumin: 3.5 g/dL (ref 3.5–5.0)
Alkaline Phosphatase: 65 U/L (ref 38–126)
Anion gap: 12 (ref 5–15)
BUN: 84 mg/dL — ABNORMAL HIGH (ref 8–23)
CO2: 21 mmol/L — ABNORMAL LOW (ref 22–32)
Calcium: 9.1 mg/dL (ref 8.9–10.3)
Chloride: 108 mmol/L (ref 98–111)
Creatinine, Ser: 2.04 mg/dL — ABNORMAL HIGH (ref 0.61–1.24)
GFR calc Af Amer: 35 mL/min — ABNORMAL LOW (ref >60)
GFR calc non Af Amer: 30 mL/min — ABNORMAL LOW (ref >60)
Glucose, Bld: 209 mg/dL — ABNORMAL HIGH (ref 70–99)
Potassium: 4.3 mmol/L (ref 3.5–5.1)
Sodium: 141 mmol/L (ref 135–145)
Total Bilirubin: 0.9 mg/dL (ref 0.3–1.2)
Total Protein: 7.5 g/dL (ref 6.5–8.1)

## 2019-09-19 LAB — GLUCOSE, CAPILLARY
Glucose-Capillary: 181 mg/dL — ABNORMAL HIGH (ref 70–99)
Glucose-Capillary: 189 mg/dL — ABNORMAL HIGH (ref 70–99)
Glucose-Capillary: 193 mg/dL — ABNORMAL HIGH (ref 70–99)
Glucose-Capillary: 196 mg/dL — ABNORMAL HIGH (ref 70–99)
Glucose-Capillary: 200 mg/dL — ABNORMAL HIGH (ref 70–99)
Glucose-Capillary: 201 mg/dL — ABNORMAL HIGH (ref 70–99)
Glucose-Capillary: 203 mg/dL — ABNORMAL HIGH (ref 70–99)
Glucose-Capillary: 204 mg/dL — ABNORMAL HIGH (ref 70–99)
Glucose-Capillary: 207 mg/dL — ABNORMAL HIGH (ref 70–99)
Glucose-Capillary: 213 mg/dL — ABNORMAL HIGH (ref 70–99)
Glucose-Capillary: 214 mg/dL — ABNORMAL HIGH (ref 70–99)
Glucose-Capillary: 244 mg/dL — ABNORMAL HIGH (ref 70–99)
Glucose-Capillary: 263 mg/dL — ABNORMAL HIGH (ref 70–99)
Glucose-Capillary: 287 mg/dL — ABNORMAL HIGH (ref 70–99)
Glucose-Capillary: 364 mg/dL — ABNORMAL HIGH (ref 70–99)
Glucose-Capillary: 402 mg/dL — ABNORMAL HIGH (ref 70–99)
Glucose-Capillary: 409 mg/dL — ABNORMAL HIGH (ref 70–99)
Glucose-Capillary: 237 mg/dL — ABNORMAL HIGH (ref 70–99)
Glucose-Capillary: 253 mg/dL — ABNORMAL HIGH (ref 70–99)
Glucose-Capillary: 216 mg/dL — ABNORMAL HIGH (ref 70–99)
Glucose-Capillary: 300 mg/dL — ABNORMAL HIGH (ref 70–99)

## 2019-09-19 LAB — FERRITIN: Ferritin: 882 ng/mL — ABNORMAL HIGH (ref 24–336)

## 2019-09-19 LAB — CBC WITH DIFFERENTIAL/PLATELET
Abs Immature Granulocytes: 0.4 10*3/uL — ABNORMAL HIGH (ref 0.00–0.07)
Basophils Absolute: 0.1 10*3/uL (ref 0.0–0.1)
Basophils Relative: 0 %
Eosinophils Absolute: 0 10*3/uL (ref 0.0–0.5)
Eosinophils Relative: 0 %
HCT: 48.7 % (ref 39.0–52.0)
Hemoglobin: 16.4 g/dL (ref 13.0–17.0)
Immature Granulocytes: 2 %
Lymphocytes Relative: 3 %
Lymphs Abs: 0.7 10*3/uL (ref 0.7–4.0)
MCH: 32.7 pg (ref 26.0–34.0)
MCHC: 33.7 g/dL (ref 30.0–36.0)
MCV: 97.2 fL (ref 80.0–100.0)
Monocytes Absolute: 0.6 10*3/uL (ref 0.1–1.0)
Monocytes Relative: 3 %
Neutro Abs: 19.3 10*3/uL — ABNORMAL HIGH (ref 1.7–7.7)
Neutrophils Relative %: 92 %
Platelets: 224 10*3/uL (ref 150–400)
RBC: 5.01 MIL/uL (ref 4.22–5.81)
RDW: 14 % (ref 11.5–15.5)
WBC: 21.1 10*3/uL — ABNORMAL HIGH (ref 4.0–10.5)
nRBC: 0 % (ref 0.0–0.2)

## 2019-09-19 LAB — D-DIMER, QUANTITATIVE: D-Dimer, Quant: 1.83 ug/mL-FEU — ABNORMAL HIGH (ref 0.00–0.50)

## 2019-09-19 LAB — MAGNESIUM: Magnesium: 2 mg/dL (ref 1.7–2.4)

## 2019-09-19 LAB — C-REACTIVE PROTEIN: CRP: 6.1 mg/dL — ABNORMAL HIGH (ref <1.0)

## 2019-09-19 MED ORDER — HALOPERIDOL LACTATE 5 MG/ML IJ SOLN
4.0000 mg | Freq: Once | INTRAMUSCULAR | Status: AC
  Administered 2019-09-19: 15:00:00 4 mg via INTRAVENOUS
  Filled 2019-09-19: qty 1

## 2019-09-19 MED ORDER — INSULIN ASPART 100 UNIT/ML ~~LOC~~ SOLN
15.0000 [IU] | Freq: Three times a day (TID) | SUBCUTANEOUS | Status: DC
  Administered 2019-09-19 – 2019-09-20 (×3): 15 [IU] via SUBCUTANEOUS

## 2019-09-19 MED ORDER — FUROSEMIDE 10 MG/ML IJ SOLN
80.0000 mg | Freq: Once | INTRAMUSCULAR | Status: AC
  Administered 2019-09-19: 09:00:00 80 mg via INTRAVENOUS
  Filled 2019-09-19: qty 8

## 2019-09-19 MED ORDER — GLUCERNA SHAKE PO LIQD
237.0000 mL | Freq: Three times a day (TID) | ORAL | Status: DC
  Administered 2019-09-19 – 2019-09-20 (×2): 237 mL via ORAL

## 2019-09-19 MED ORDER — QUETIAPINE FUMARATE 25 MG PO TABS
25.0000 mg | ORAL_TABLET | Freq: Every day | ORAL | Status: DC
  Administered 2019-09-19: 21:00:00 25 mg via ORAL
  Filled 2019-09-19: qty 1

## 2019-09-19 MED ORDER — INSULIN DETEMIR 100 UNIT/ML ~~LOC~~ SOLN
44.0000 [IU] | Freq: Two times a day (BID) | SUBCUTANEOUS | Status: DC
  Administered 2019-09-19 – 2019-09-20 (×3): 44 [IU] via SUBCUTANEOUS
  Filled 2019-09-19 (×4): qty 0.44

## 2019-09-19 MED ORDER — INSULIN ASPART 100 UNIT/ML ~~LOC~~ SOLN
0.0000 [IU] | Freq: Every day | SUBCUTANEOUS | Status: DC
  Administered 2019-09-19: 22:00:00 2 [IU] via SUBCUTANEOUS

## 2019-09-19 MED ORDER — LIP MEDEX EX OINT
TOPICAL_OINTMENT | CUTANEOUS | Status: DC | PRN
  Filled 2019-09-19: qty 7

## 2019-09-19 MED ORDER — INSULIN ASPART 100 UNIT/ML ~~LOC~~ SOLN
0.0000 [IU] | Freq: Three times a day (TID) | SUBCUTANEOUS | Status: DC
  Administered 2019-09-19: 12:00:00 11 [IU] via SUBCUTANEOUS
  Administered 2019-09-19: 16:00:00 15 [IU] via SUBCUTANEOUS
  Administered 2019-09-20: 7 [IU] via SUBCUTANEOUS

## 2019-09-19 MED ORDER — METHYLPREDNISOLONE SODIUM SUCC 40 MG IJ SOLR
40.0000 mg | Freq: Two times a day (BID) | INTRAMUSCULAR | Status: DC
  Administered 2019-09-19 – 2019-09-20 (×2): 40 mg via INTRAVENOUS
  Filled 2019-09-19 (×2): qty 1

## 2019-09-19 NOTE — Progress Notes (Signed)
PT Cancellation Note  Patient Details Name: Jonathan Roman MRN: [id number] DOB: [birth year]   Cancelled Treatment:    Reason Eval/Treat Not Completed: Medical issues which prohibited therapy.  RN, [name] reporting deteriorating resp status.  Pt on increased O2 with deeper desaturation, confusion.  She recommended holding for today.  He may be headed toward comfort.  PT to check back tomorrow. Thanks,  [name], PT, DPT  Acute Rehabilitation [phone] pager [phone] office  @ [facility]: [phone]     [name] 09/19/2019, 11:11 AM

## 2019-09-19 NOTE — Progress Notes (Signed)
PROGRESS NOTE                                                                                                                                                                                                             Patient Demographics:    Jonathan Roman, is a 79 y.o. male, DOB - [birth year], [id number]  Outpatient Primary MD for the patient is [name], MD   Admit date - 08/23/2019   LOS - 3  No chief complaint on file.      Brief Narrative: Patient is a 79 y.o. male with PMHx of HTN, HLD, gout-was Covid positive on 1/15-who presented to [facility] ED with progressive shortness of breath-found to have acute hypoxic respiratory failure secondary to COVID-19 pneumonia.   Subjective:   Had an episode of confusion last night but this morning awake and alert.  He is still very hypoxic-on both NRB and heated high flow.  He is short of breath with minimal activity.   Assessment  & Plan :   Acute Hypoxic Resp Failure due to Covid 19 Viral pneumonia: Continues to have severe hypoxemia-requiring both heated high flow and NRB.  Continue steroids and remdesivir.    Goals of care: Remains very tenuous-now with intermittent encephalopathy.  DNR in place.  Spoke at length to patient's daughter-continue full supportive care-but family does realize that if patient deteriorates significantly-apart from comfort care we really would not have other options.    Fever: afebrile   O2 requirements:  SpO2: (!) 89 % O2 Flow Rate (L/min): 30 L/min(NRB 15L) FiO2 (%): 100 %   COVID-19 Labs: Recent Labs    09/17/19 0534 09/18/19 0415 09/19/19 0420 09/19/19 0530  DDIMER 1.99* 2.12*  --  1.83*  FERRITIN 832* 1,028* 882*  --   CRP 12.4* 11.8* 6.1*  --        Component Value Date/Time   BNP 35.3 09/17/2019 0534    Recent Labs  Lab 09/05/2019 1154 09/17/19 0534 09/18/19 0415  PROCALCITON 0.41 0.33 0.31    Lab Results  Component  Value Date   SARSCOV2NAA POSITIVE (A) 09/06/2019     COVID-19 Medications: Steroids: 1/25>> Remdesivir: 1/25>> Actemra: 1/26 x 1  Other medications: Diuretics: Trace lower extremity edema continues-repeat Lasix 80 mg IV x1.  Antibiotics: Cefepime/Flagyl: 1/25 x 1 at ED.  Prone/Incentive Spirometry: encouraged patient  to lie prone for 3-4 hours at a time for a total of 16 hours a day, and to encourage incentive spirometry use 3-4/hour.  DVT Prophylaxis  : Intermediate dose heparin  Acute metabolic encephalopathy: Continues to have periods of confusion-probably from steroids-severe hypoxia.  Supportive care-we will slowly titrate down steroids now that CRP is decreasing.  We will go ahead and start Seroquel nightly.  Mild AKI superimposed on CKD stage IV: AKI likely hemodynamically mediated-renal function stable-follow for now.   DM-2 with uncontrolled hyperglycemia secondary to steroids: CBGs somewhat better-stop insulin infusion-transition to Levemir 44 units twice daily, 15 units of NovoLog with meals and resistant SSI.  Follow and optimize.  Recent Labs    09/19/19 0917 09/19/19 1045 09/19/19 1131  GLUCAP 204* 237* 253*   HTN: Controlled-continue metoprolol-lisinopril remains on hold.  Gout: No flare-continue allopurinol  Obesity: Estimated body mass index is 41.61 kg/m as calculated from the following:   Height as of this encounter: 5\' 10"  (1.778 m).   Weight as of this encounter: 131.5 kg.    Consults  :  None  Procedures  :  None  ABG: No results found for: PHART, PCO2ART, PO2ART, HCO3, TCO2, ACIDBASEDEF, O2SAT  Vent Settings: N/A  Condition - Extremely Guarded-at risk for further decline.  Family Communication  : Daughter over the phone on 1/28.  Code Status : DNR.  Diet :  Diet Order            Diet heart healthy/carb modified Room service appropriate? Yes; Fluid consistency: Thin  Diet effective now               Disposition Plan  :  Remain  hospitalized-not stable for discharge.  Disposition will depend on hospital course  Barriers to discharge: Hypoxia requiring O2 supplementation/complete 5 days of IV Remdesivir  Antimicorbials  :    Anti-infectives (From admission, onward)   Start     Dose/Rate Route Frequency Ordered Stop   09/17/19 1000  remdesivir 100 mg in sodium chloride 0.9 % 100 mL IVPB  Status:  Discontinued     100 mg 200 mL/hr over 30 Minutes Intravenous Daily 09/11/2019 1618 08/27/2019 1621   09/17/19 1000  remdesivir 100 mg in sodium chloride 0.9 % 100 mL IVPB     100 mg 200 mL/hr over 30 Minutes Intravenous Daily 09/11/2019 1621 09/21/19 0959   09/06/2019 1800  remdesivir 200 mg in sodium chloride 0.9% 250 mL IVPB     200 mg 580 mL/hr over 30 Minutes Intravenous Once 09/14/2019 1621 09/09/2019 2234   09/02/2019 1618  remdesivir 200 mg in sodium chloride 0.9% 250 mL IVPB  Status:  Discontinued     200 mg 580 mL/hr over 30 Minutes Intravenous Once 08/30/2019 1618 09/14/2019 1621   09/21/2019 1245  vancomycin (VANCOCIN) 2,500 mg in sodium chloride 0.9 % 500 mL IVPB     2,500 mg 250 mL/hr over 120 Minutes Intravenous STAT 09/19/2019 1230 09/17/2019 1738   09/04/2019 1230  ceFEPIme (MAXIPIME) 2 g in sodium chloride 0.9 % 100 mL IVPB     2 g 200 mL/hr over 30 Minutes Intravenous  Once 09/06/2019 1220 08/26/2019 1321   09/06/2019 1230  metroNIDAZOLE (FLAGYL) IVPB 500 mg     500 mg 100 mL/hr over 60 Minutes Intravenous  Once 09/03/2019 1220 08/31/2019 1321   08/25/2019 1230  vancomycin (VANCOCIN) IVPB 1000 mg/200 mL premix  Status:  Discontinued     1,000 mg 200 mL/hr over 60 Minutes Intravenous  Once 08/25/2019 1220 08/25/2019 1230      Inpatient Medications  Scheduled Meds: . allopurinol  100 mg Oral Daily  . vitamin C  500 mg Oral Daily  . aspirin EC  81 mg Oral Daily  . benzonatate  200 mg Oral TID  . famotidine  20 mg Oral Daily  . feeding supplement (GLUCERNA SHAKE)  237 mL Oral TID BM  . heparin injection (subcutaneous)  7,500 Units  Subcutaneous Q8H  . insulin aspart  0-20 Units Subcutaneous TID WC  . insulin aspart  0-5 Units Subcutaneous QHS  . insulin aspart  15 Units Subcutaneous TID WC  . insulin detemir  44 Units Subcutaneous BID  . Ipratropium-Albuterol  1 puff Inhalation Q6H  . linagliptin  5 mg Oral Daily  . methylPREDNISolone (SOLU-MEDROL) injection  0.5 mg/kg Intravenous Q12H  . metoprolol tartrate  12.5 mg Oral BID  . nortriptyline  10 mg Oral QHS  . sodium chloride flush  10-40 mL Intracatheter Q12H  . zinc sulfate  220 mg Oral Daily   Continuous Infusions: . sodium chloride 50 mL/hr at 09/18/19 1725  . dextrose 5 % and 0.45% NaCl Stopped (09/19/19 1137)  . insulin Stopped (09/19/19 1137)  . remdesivir 100 mg in NS 100 mL Stopped (09/19/19 0930)   PRN Meds:.acetaminophen **OR** acetaminophen, chlorpheniramine-HYDROcodone, dextrose, guaiFENesin-dextromethorphan, hydrALAZINE, lip balm, ondansetron **OR** ondansetron (ZOFRAN) IV, oxyCODONE-acetaminophen, polyethylene glycol, sodium chloride flush   Time Spent in minutes  55   The patient is critically ill with multiple organ system failure and requires high complexity decision making for assessment and support, frequent evaluation and titration of therapies, advanced monitoring, review of radiographic studies and interpretation of complex data.   See all Orders from today for further details   Oren Binet M.D on 09/19/2019 at 3:15 PM  To page go to [email or web address] - use universal password  [facility] -  Office  [phone]    Objective:   Vitals:   09/19/19 0744 09/19/19 0800 09/19/19 1151 09/19/19 1200  BP: (!) 156/87 (!) 154/96 (!) 160/88 (!) 160/88  Pulse: (!) 110 (!) 104 (!) 107 (!) 106  Resp: (!) 25 20 (!) 22 (!) 22  Temp:  97.6 F (36.4 C)  98 F (36.7 C)  TempSrc:  Axillary  Axillary  SpO2: (!) 86% (!) 88% 90% (!) 89%  Weight:      Height:        Wt Readings from Last 3 Encounters:  09/17/19 131.5 kg      Intake/Output Summary (Last 24 hours) at 09/19/2019 1515 Last data filed at 09/19/2019 1239 Gross per 24 hour  Intake 945.25 ml  Output 1650 ml  Net -704.75 ml     Physical Exam Gen Exam:Alert awake-not in any distress HEENT:atraumatic, normocephalic Chest: B/L clear to auscultation anteriorly CVS:S1S2 regular Abdomen:soft non tender, non distended Extremities:+ edema Neurology: Non focal Skin: no rash   Data Review:    CBC Recent Labs  Lab 08/23/2019 1154 09/17/19 0534 09/18/19 0415 09/19/19 0530  WBC 6.7 7.9 13.7* 21.1*  HGB 16.7 15.3 15.8 16.4  HCT 50.7 47.4 48.7 48.7  PLT 111* 118* 149* 224  MCV 100.2* 99.4 99.2 97.2  MCH 33.0 32.1 32.2 32.7  MCHC 32.9 32.3 32.4 33.7  RDW 14.1 14.0 13.9 14.0  LYMPHSABS 0.5* 0.5* 0.6* 0.7  MONOABS 0.7 0.3 0.4 0.6  EOSABS 0.0 0.0 0.0 0.0  BASOSABS 0.0 0.0 0.0 0.1    Chemistries  Recent Labs  Lab 09/22/2019 1154 09/17/19  [id number] 09/18/19 0415 09/19/19 0530  NA 134* 137 138 141  K 4.8 4.7 4.6 4.3  CL 101 107 103 108  CO2 17* 20* 20* 21*  GLUCOSE 405* 401* 369* 209*  BUN 55* 62* 76* 84*  CREATININE 2.67* 2.25* 2.18* 2.04*  CALCIUM 8.9 8.4* 9.0 9.1  MG  --  1.9  --  2.0  AST 91* 77* 78* 66*  ALT 57* 52* 51* 49*  ALKPHOS 38 36* 50 65  BILITOT 1.4* 0.7 0.8 0.9   ------------------------------------------------------------------------------------------------------------------ No results for input(s): CHOL, HDL, LDLCALC, TRIG, CHOLHDL, LDLDIRECT in the last 72 hours.  Lab Results  Component Value Date   HGBA1C 8.3 (H) 09/17/2019   ------------------------------------------------------------------------------------------------------------------ Recent Labs    09/17/19 0543  TSH 0.422   ------------------------------------------------------------------------------------------------------------------ Recent Labs    09/18/19 0415 09/19/19 0420  FERRITIN 1,028* 882*    Coagulation profile No results for  input(s): INR, PROTIME in the last 168 hours.  Recent Labs    09/18/19 0415 09/19/19 0530  DDIMER 2.12* 1.83*    Cardiac Enzymes No results for input(s): CKMB, TROPONINI, MYOGLOBIN in the last 168 hours.  Invalid input(s): CK ------------------------------------------------------------------------------------------------------------------    Component Value Date/Time   BNP 35.3 09/17/2019 0534    Micro Results Recent Results (from the past 240 hour(s))  Blood Culture (routine x 2)     Status: None (Preliminary result)   Collection Time: 09/08/2019 11:54 AM   Specimen: BLOOD LEFT FOREARM  Result Value Ref Range Status   Specimen Description   Final    BLOOD LEFT FOREARM Performed at [facility] [address] [place], [place] [address]    Special Requests   Final    BOTTLES DRAWN AEROBIC AND ANAEROBIC Blood Culture adequate volume Performed at [facility] [address], [place], [place] [address]    Culture   Final    NO GROWTH 3 DAYS Performed at [facility], [place] [address] [place], [place] [address]    Report Status PENDING  Incomplete  Respiratory Panel by RT PCR (Flu A&B, Covid) - Nasopharyngeal Swab     Status: Abnormal   Collection Time: 09/18/2019 11:57 AM   Specimen: Nasopharyngeal Swab  Result Value Ref Range Status   SARS Coronavirus 2 by RT PCR POSITIVE (A) NEGATIVE Final    Comment: RESULT CALLED TO, READ BACK BY AND VERIFIED WITH: P.DOWD AT 1606 ON 09/08/2019 BY N.THOMPSON (NOTE) SARS-CoV-2 target nucleic acids are DETECTED. SARS-CoV-2 RNA is generally detectable in upper respiratory specimens  during the acute phase of infection. Positive results are indicative of the presence of the identified virus, but do not rule out bacterial infection or co-infection with other pathogens not detected by the test. Clinical correlation with patient history and other diagnostic information is necessary to determine  patient infection status. The expected result is Negative. Fact Sheet for Patients:  PinkCheek.be Fact Sheet for Healthcare Providers: GravelBags.it This test is not yet approved or cleared by the [place] FDA and  has been authorized for detection and/or diagnosis of SARS-CoV-2 by FDA under an Emergency Use Authorization (EUA).  This EUA will remain in effect (meaning this test can be use d) for the duration of  the COVID-19 declaration under Section 564(b)(1) of the Act, 21 U.S.C. section 360bbb-3(b)(1), unless the authorization is terminated or revoked sooner.    Influenza A by PCR NEGATIVE NEGATIVE Final   Influenza B by PCR NEGATIVE NEGATIVE Final    Comment: (NOTE)  The Xpert Xpress SARS-CoV-2/FLU/RSV assay is intended as an aid in  the diagnosis of influenza from Nasopharyngeal swab specimens and  should not be used as a sole basis for treatment. Nasal washings and  aspirates are unacceptable for Xpert Xpress SARS-CoV-2/FLU/RSV  testing. Fact Sheet for Patients: PinkCheek.be Fact Sheet for Healthcare Providers: GravelBags.it This test is not yet approved or cleared by the [place] FDA and  has been authorized for detection and/or diagnosis of SARS-CoV-2 by  FDA under an Emergency Use Authorization (EUA). This EUA will remain  in effect (meaning this test can be used) for the duration of the  Covid-19 declaration under Section 564(b)(1) of the Act, 21  U.S.C. section 360bbb-3(b)(1), unless the authorization is  terminated or revoked. Performed at [facility], [place] [address], [place], [place] [address]   Blood Culture (routine x 2)     Status: None (Preliminary result)   Collection Time: 09/09/2019 12:06 PM   Specimen: BLOOD  Result Value Ref Range Status   Specimen Description   Final    BLOOD RIGHT ANTECUBITAL Performed at [facility] [address] [place], [place] [address]    Special Requests   Final    BOTTLES DRAWN AEROBIC AND ANAEROBIC Blood Culture adequate volume Performed at [facility] [address], [place], [place] [address]    Culture   Final    NO GROWTH 3 DAYS Performed at [facility], [place] [address] [place], [place] [address]    Report Status PENDING  Incomplete  MRSA PCR Screening     Status: None   Collection Time: 09/17/19  3:57 AM   Specimen: Nasopharyngeal  Result Value Ref Range Status   MRSA by PCR NEGATIVE NEGATIVE Final    Comment:        The GeneXpert MRSA Assay (FDA approved for NASAL specimens only), is one component of a comprehensive MRSA colonization surveillance program. It is not intended to diagnose MRSA infection nor to guide or monitor treatment for MRSA infections. Performed at [facility], [place] [address], [place],  [address]     Radiology Reports DG Chest [place] 1 View  Result Date: 09/22/2019 CLINICAL DATA:  Shortness of breath, hypoxia EXAM: PORTABLE CHEST 1 VIEW COMPARISON:  04/13/2011 FINDINGS: Prior CABG. Heart size is stable prior. Lung volumes are low. Increased interstitial markings within the perihilar and bibasilar regions, left greater than right. No significant pleural fluid collection. No pneumothorax. IMPRESSION: Increased interstitial markings within the perihilar and bibasilar regions, left greater than right. Findings could reflect edema versus atypical/viral infection. Electronically Signed   By: Davina Poke D.O.   On: 09/22/2019 12:35   DG Chest Port 1V same Day  Result Date: 09/18/2019 CLINICAL DATA:  Shortness of breath. Respiratory failure due to COVID 19. EXAM: PORTABLE CHEST 1 VIEW COMPARISON:  Single-view of the chest 09/17/2019 and 09/04/2019. FINDINGS: Hazy bilateral airspace opacities persist without change. No pneumothorax or pleural effusion. Heart size is  normal. No acute bony abnormality. IMPRESSION: No change in bilateral airspace disease consistent with pneumonia. Electronically Signed   By: Inge Rise M.D.   On: 09/18/2019 13:34   DG Chest Port 1V same Day  Result Date: 09/17/2019 CLINICAL DATA:  Hypoxia and respiratory failure secondary to COVID pneumonia. EXAM: PORTABLE CHEST 1 VIEW COMPARISON:  08/26/2019 FINDINGS: Stable heart size. Interstitial infiltrates predominantly in the lower lung zones bilaterally show some interval worsening since the prior chest x-ray.  No overt airspace edema, pleural effusions or pneumothorax identified. IMPRESSION: Some interval worsening of bilateral pulmonary interstitial infiltrates. Electronically Signed   By: Aletta Edouard M.D.   On: 09/17/2019 09:19

## 2019-09-19 NOTE — Plan of Care (Signed)
  Problem: Coping: Goal: Psychosocial and spiritual needs will be supported Outcome: Progressing   

## 2019-09-19 NOTE — Progress Notes (Signed)
This RN called to room by bed alarm to find that patient had removed O2 devices and monitoring equipment and was attempting to remove midline.  Found patient sitting on edge of bed, hypoxic, refusing to put O2 mask back on, attempting to get out of bed.  When this RN attempted to assist, pt became combative. O2 sats reading in 50's, HR in 130's.  Physician notified.  Orders for haldol received and administered.  Patient assisted back to bed x4 assist.  Sats now returned to previous 89% on 30L HHFNC and nonrebreather mask at 15L.  Patient calm and resting with eyes closed at this time.  Will continue to monitor closely.

## 2019-09-19 NOTE — Progress Notes (Signed)
Spoke with pt daughter Colletta [place] and answered all questions. She stated at this time that she would like [place] to continue to do everything we can for pt. Asked [place] to call with any changes. I assured her we would.

## 2019-09-20 LAB — CBC WITH DIFFERENTIAL/PLATELET
Abs Immature Granulocytes: 0.52 10*3/uL — ABNORMAL HIGH (ref 0.00–0.07)
Basophils Absolute: 0.1 10*3/uL (ref 0.0–0.1)
Basophils Relative: 1 %
Eosinophils Absolute: 0 10*3/uL (ref 0.0–0.5)
Eosinophils Relative: 0 %
HCT: 48.5 % (ref 39.0–52.0)
Hemoglobin: 16.4 g/dL (ref 13.0–17.0)
Immature Granulocytes: 3 %
Lymphocytes Relative: 5 %
Lymphs Abs: 1 10*3/uL (ref 0.7–4.0)
MCH: 32.8 pg (ref 26.0–34.0)
MCHC: 33.8 g/dL (ref 30.0–36.0)
MCV: 97 fL (ref 80.0–100.0)
Monocytes Absolute: 0.9 10*3/uL (ref 0.1–1.0)
Monocytes Relative: 4 %
Neutro Abs: 18.1 10*3/uL — ABNORMAL HIGH (ref 1.7–7.7)
Neutrophils Relative %: 87 %
Platelets: 217 10*3/uL (ref 150–400)
RBC: 5 MIL/uL (ref 4.22–5.81)
RDW: 14.1 % (ref 11.5–15.5)
WBC: 20.6 10*3/uL — ABNORMAL HIGH (ref 4.0–10.5)
nRBC: 0 % (ref 0.0–0.2)

## 2019-09-20 LAB — COMPREHENSIVE METABOLIC PANEL
ALT: 46 U/L — ABNORMAL HIGH (ref 0–44)
AST: 55 U/L — ABNORMAL HIGH (ref 15–41)
Albumin: 3.4 g/dL — ABNORMAL LOW (ref 3.5–5.0)
Alkaline Phosphatase: 76 U/L (ref 38–126)
Anion gap: 14 (ref 5–15)
BUN: 88 mg/dL — ABNORMAL HIGH (ref 8–23)
CO2: 22 mmol/L (ref 22–32)
Calcium: 9.4 mg/dL (ref 8.9–10.3)
Chloride: 106 mmol/L (ref 98–111)
Creatinine, Ser: 2.15 mg/dL — ABNORMAL HIGH (ref 0.61–1.24)
GFR calc Af Amer: 33 mL/min — ABNORMAL LOW (ref >60)
GFR calc non Af Amer: 28 mL/min — ABNORMAL LOW (ref >60)
Glucose, Bld: 265 mg/dL — ABNORMAL HIGH (ref 70–99)
Potassium: 4.1 mmol/L (ref 3.5–5.1)
Sodium: 142 mmol/L (ref 135–145)
Total Bilirubin: 1.2 mg/dL (ref 0.3–1.2)
Total Protein: 7.1 g/dL (ref 6.5–8.1)

## 2019-09-20 LAB — C-REACTIVE PROTEIN: CRP: 2.7 mg/dL — ABNORMAL HIGH (ref <1.0)

## 2019-09-20 LAB — GLUCOSE, CAPILLARY
Glucose-Capillary: 233 mg/dL — ABNORMAL HIGH (ref 70–99)
Glucose-Capillary: 249 mg/dL — ABNORMAL HIGH (ref 70–99)

## 2019-09-20 LAB — D-DIMER, QUANTITATIVE: D-Dimer, Quant: 1.76 ug/mL-FEU — ABNORMAL HIGH (ref 0.00–0.50)

## 2019-09-20 LAB — FERRITIN: Ferritin: 730 ng/mL — ABNORMAL HIGH (ref 24–336)

## 2019-09-20 MED ORDER — LORAZEPAM 2 MG/ML IJ SOLN
1.0000 mg | Freq: Once | INTRAMUSCULAR | Status: AC
  Administered 2019-09-20: 1 mg via INTRAVENOUS
  Filled 2019-09-20: qty 1

## 2019-09-20 MED ORDER — HALOPERIDOL LACTATE 5 MG/ML IJ SOLN
5.0000 mg | INTRAMUSCULAR | Status: DC | PRN
  Administered 2019-09-20: 10:00:00 5 mg via INTRAVENOUS
  Filled 2019-09-20: qty 1

## 2019-09-20 MED ORDER — LORAZEPAM 2 MG/ML PO CONC: 1.0000 mg | ORAL | Status: DC | PRN

## 2019-09-20 MED ORDER — HALOPERIDOL 5 MG PO TABS
5.0000 mg | ORAL_TABLET | ORAL | Status: DC | PRN
  Filled 2019-09-20: qty 1

## 2019-09-20 MED ORDER — LORAZEPAM 1 MG PO TABS: 1.0000 mg | ORAL_TABLET | ORAL | Status: DC | PRN

## 2019-09-20 MED ORDER — MORPHINE SULFATE (PF) 2 MG/ML IV SOLN
2.0000 mg | INTRAVENOUS | Status: AC
  Administered 2019-09-20: 09:00:00 2 mg via INTRAVENOUS
  Filled 2019-09-20: qty 1

## 2019-09-20 MED ORDER — LORAZEPAM 2 MG/ML IJ SOLN
1.0000 mg | INTRAMUSCULAR | Status: DC | PRN
  Administered 2019-09-20: 2 mg via INTRAVENOUS
  Filled 2019-09-20: qty 1

## 2019-09-20 MED ORDER — HALOPERIDOL LACTATE 2 MG/ML PO CONC
5.0000 mg | ORAL | Status: DC | PRN
  Filled 2019-09-20: qty 2.5

## 2019-09-20 MED ORDER — HYDROMORPHONE HCL 1 MG/ML IJ SOLN
1.0000 mg | INTRAMUSCULAR | Status: DC | PRN
  Administered 2019-09-20: 1 mg via INTRAVENOUS
  Filled 2019-09-20: qty 1

## 2019-09-20 MED ORDER — SODIUM CHLORIDE 0.9 % IV SOLN
1.0000 mg/h | INTRAVENOUS | Status: DC
  Administered 2019-09-20: 1 mg/h via INTRAVENOUS
  Filled 2019-09-20: qty 5

## 2019-09-20 MED ORDER — LORAZEPAM 2 MG/ML IJ SOLN: 1.0000 mg | INTRAMUSCULAR | Status: DC | PRN

## 2019-09-21 LAB — CULTURE, BLOOD (ROUTINE X 2)
Culture: NO GROWTH
Culture: NO GROWTH
Special Requests: ADEQUATE
Special Requests: ADEQUATE

## 2019-09-23 NOTE — Progress Notes (Signed)
38ml dilauded drip wasted with Wonda Cheng, RN at [phone]. Witnessed by this nurse.

## 2019-09-23 NOTE — Death Summary Note (Addendum)
DEATH SUMMARY   Patient Details  Name: Jonathan Roman MRN: [id number] DOB: 10-28-1940  Admission/Discharge Information   Admit Date:  10/15/19  Date of Death: Date of Death: 10/19/19  Time of Death: Time of Death: December 11, 1416  Length of Stay: 4  Referring Physician: [name], MD   Reason(s) for Hospitalization   Acute hypoxic respiratory failure secondary to COVID-19 pneumonia  Diagnoses  Preliminary cause of death:   Acute hypoxic respiratory failure secondary to COVID-19 pneumonia  Secondary Diagnoses (including complications and co-morbidities):  Principal Problem:   Pneumonia due to COVID-19 virus Active Problems:   Acute respiratory failure with hypoxia ([place])   Essential hypertension   Gout   Hyperlipidemia   Obesity, Class III, BMI 40-49.9 (morbid obesity) ([place])   CKD (chronic kidney disease), stage IV (HCC)   Type 2 diabetes mellitus with stage 4 chronic kidney disease [facility])   Brief Hospital Course (including significant findings, care, treatment, and services provided and events leading to death)  Brief summary: Jonathan Roman is a 79 y.o. year old male with PMHx of HTN, HLD, gout-was Covid positive on 1/15-who presented to [facility] ED with progressive shortness of breath-found to have acute hypoxic respiratory failure secondary to COVID-19 pneumonia-requiring up to 12 L of oxygen on presentation to the emergency room.  Hospital course complicated by worsening hypoxemia-in spite of treatment with steroids/remdesivir and Actemra after discussion with patient-ended with family-patient was made a DNR.  Unfortunately, patient continued to deteriorate-and also started to become encephalopathic.  On 1/29-his confusion worsened-hypoxemia also worsened.  After discussion with patient's son over the phone-patient was transitioned to full comfort measures.  Hospital course by problem list Acute Hypoxic Resp Failure due to Covid 19 Viral pneumonia: Presented with severe hypoxemia which  unfortunately continued to deteriorate in spite of being treated with steroids/remdesivir and Actemra.  Due to worsening hypoxemia-after discussion with patient-given his advanced age-and numerous risk factors-he was agreeable for a DNR.  This was conveyed to the patient's daughter the very same day.  He initially was on HFNC-then required both HFNC and NRB-and was subsequently transitioned to heated high flow.  As noted above-along with worsening hypoxemia-he also developed encephalopathy.  On 1/29-he deteriorated further-after discussion with patient's son over the phone-he was made full comfort care-patient subsequently passed away at 1418 hrs.  AKI on CKD stage IV: AKI was felt to be hemodynamically mediated-renal function stabilized and was close to baseline.  DM-2 with uncontrolled hyperglycemia secondary to steroids: Had severe hypoglycemia-and was started on IV insulin infusion-and subsequently after better glycemic control was transitioned back to Levemir and sliding scale insulin with reasonable control.  HTN: Was controlled with metoprolol  Gout: No flare  Morbid obesity   Pertinent Labs and Studies  Significant Diagnostic Studies DG Chest Port 1 View  Result Date: 2019/10/15 CLINICAL DATA:  Shortness of breath, hypoxia EXAM: PORTABLE CHEST 1 VIEW COMPARISON:  04/13/2011 FINDINGS: Prior CABG. Heart size is stable prior. Lung volumes are low. Increased interstitial markings within the perihilar and bibasilar regions, left greater than right. No significant pleural fluid collection. No pneumothorax. IMPRESSION: Increased interstitial markings within the perihilar and bibasilar regions, left greater than right. Findings could reflect edema versus atypical/viral infection. Electronically Signed   By: Davina Poke D.O.   On: Oct 15, 2019 12:35   DG Chest Port 1V same Day  Result Date: 09/18/2019 CLINICAL DATA:  Shortness of breath. Respiratory failure due to COVID 19. EXAM: PORTABLE  CHEST 1 VIEW COMPARISON:  Single-view of  the chest 09/17/2019 and 09/15/2019. FINDINGS: Hazy bilateral airspace opacities persist without change. No pneumothorax or pleural effusion. Heart size is normal. No acute bony abnormality. IMPRESSION: No change in bilateral airspace disease consistent with pneumonia. Electronically Signed   By: Inge Rise M.D.   On: 09/18/2019 13:34   DG Chest Port 1V same Day  Result Date: 09/17/2019 CLINICAL DATA:  Hypoxia and respiratory failure secondary to COVID pneumonia. EXAM: PORTABLE CHEST 1 VIEW COMPARISON:  09/19/2019 FINDINGS: Stable heart size. Interstitial infiltrates predominantly in the lower lung zones bilaterally show some interval worsening since the prior chest x-ray. No overt airspace edema, pleural effusions or pneumothorax identified. IMPRESSION: Some interval worsening of bilateral pulmonary interstitial infiltrates. Electronically Signed   By: Aletta Edouard M.D.   On: 09/17/2019 09:19    Microbiology Recent Results (from the past 240 hour(s))  Blood Culture (routine x 2)     Status: None (Preliminary result)   Collection Time: 09/02/2019 11:54 AM   Specimen: BLOOD LEFT FOREARM  Result Value Ref Range Status   Specimen Description   Final    BLOOD LEFT FOREARM Performed at [facility] [address], [place], [place] [address]    Special Requests   Final    BOTTLES DRAWN AEROBIC AND ANAEROBIC Blood Culture adequate volume Performed at [facility] [address] [place], [place] [address]    Culture   Final    NO GROWTH 4 DAYS Performed at [facility], [place] [address] [place], [place] [address]    Report Status PENDING  Incomplete  Respiratory Panel by RT PCR (Flu A&B, Covid) - Nasopharyngeal Swab     Status: Abnormal   Collection Time: 08/30/2019 11:57 AM   Specimen: Nasopharyngeal Swab  Result Value Ref Range Status   SARS Coronavirus 2 by RT PCR POSITIVE (A) NEGATIVE Final     Comment: RESULT CALLED TO, READ BACK BY AND VERIFIED WITH: P.DOWD AT 1606 ON 08/24/2019 BY N.THOMPSON (NOTE) SARS-CoV-2 target nucleic acids are DETECTED. SARS-CoV-2 RNA is generally detectable in upper respiratory specimens  during the acute phase of infection. Positive results are indicative of the presence of the identified virus, but do not rule out bacterial infection or co-infection with other pathogens not detected by the test. Clinical correlation with patient history and other diagnostic information is necessary to determine patient infection status. The expected result is Negative. Fact Sheet for Patients:  PinkCheek.be Fact Sheet for Healthcare Providers: GravelBags.it This test is not yet approved or cleared by the [place] FDA and  has been authorized for detection and/or diagnosis of SARS-CoV-2 by FDA under an Emergency Use Authorization (EUA).  This EUA will remain in effect (meaning this test can be use d) for the duration of  the COVID-19 declaration under Section 564(b)(1) of the Act, 21 U.S.C. section 360bbb-3(b)(1), unless the authorization is terminated or revoked sooner.    Influenza A by PCR NEGATIVE NEGATIVE Final   Influenza B by PCR NEGATIVE NEGATIVE Final    Comment: (NOTE) The Xpert Xpress SARS-CoV-2/FLU/RSV assay is intended as an aid in  the diagnosis of influenza from Nasopharyngeal swab specimens and  should not be used as a sole basis for treatment. Nasal washings and  aspirates are unacceptable for Xpert Xpress SARS-CoV-2/FLU/RSV  testing. Fact Sheet for Patients: PinkCheek.be Fact Sheet for Healthcare Providers: GravelBags.it This test is not yet approved or cleared by the [place] FDA and  has been authorized for detection and/or diagnosis  of SARS-CoV-2 by  FDA under an Emergency Use Authorization (EUA). This EUA will remain   in effect (meaning this test can be used) for the duration of the  Covid-19 declaration under Section 564(b)(1) of the Act, 21  U.S.C. section 360bbb-3(b)(1), unless the authorization is  terminated or revoked. Performed at [facility], [place] [address] [place], [place] [address]   Blood Culture (routine x 2)     Status: None (Preliminary result)   Collection Time: 09/21/2019 12:06 PM   Specimen: BLOOD  Result Value Ref Range Status   Specimen Description   Final    BLOOD RIGHT ANTECUBITAL Performed at [facility] [address], [place], [place] [address]    Special Requests   Final    BOTTLES DRAWN AEROBIC AND ANAEROBIC Blood Culture adequate volume Performed at [facility] [address], [place], [place] [address]    Culture   Final    NO GROWTH 4 DAYS Performed at [facility], [place] [address] [place], [place] [address]    Report Status PENDING  Incomplete  MRSA PCR Screening     Status: None   Collection Time: 09/17/19  3:57 AM   Specimen: Nasopharyngeal  Result Value Ref Range Status   MRSA by PCR NEGATIVE NEGATIVE Final    Comment:        The GeneXpert MRSA Assay (FDA approved for NASAL specimens only), is one component of a comprehensive MRSA colonization surveillance program. It is not intended to diagnose MRSA infection nor to guide or monitor treatment for MRSA infections. Performed at [facility], [place] [address] [place], [place] [address]     Lab Basic Metabolic Panel: Recent Labs  Lab 09/10/2019 1154 09/17/19 0534 09/18/19 0415 09/19/19 0530 2019-10-17 0700  NA 134* 137 138 141 142  K 4.8 4.7 4.6 4.3 4.1  CL 101 107 103 108 106  CO2 17* 20* 20* 21* 22  GLUCOSE 405* 401* 369* 209* 265*  BUN 55* 62* 76* 84* 88*  CREATININE 2.67* 2.25* 2.18* 2.04* 2.15*  CALCIUM 8.9 8.4* 9.0 9.1 9.4  MG  --  1.9  --  2.0  --    Liver Function Tests: Recent Labs  Lab  09/19/2019 1154 09/17/19 0534 09/18/19 0415 09/19/19 0530 10-17-2019 0700  AST 91* 77* 78* 66* 55*  ALT 57* 52* 51* 49* 46*  ALKPHOS 38 36* 50 65 76  BILITOT 1.4* 0.7 0.8 0.9 1.2  PROT 7.7 7.1 7.2 7.5 7.1  ALBUMIN 3.7 3.3* 3.4* 3.5 3.4*   No results for input(s): LIPASE, AMYLASE in the last 168 hours. No results for input(s): AMMONIA in the last 168 hours. CBC: Recent Labs  Lab 08/23/2019 1154 09/17/19 0534 09/18/19 0415 09/19/19 0530 10-17-19 0700  WBC 6.7 7.9 13.7* 21.1* 20.6*  NEUTROABS 5.4 7.1 12.5* 19.3* 18.1*  HGB 16.7 15.3 15.8 16.4 16.4  HCT 50.7 47.4 48.7 48.7 48.5  MCV 100.2* 99.4 99.2 97.2 97.0  PLT 111* 118* 149* 224 217   Cardiac Enzymes: No results for input(s): CKTOTAL, CKMB, CKMBINDEX, TROPONINI in the last 168 hours. Sepsis Labs: Recent Labs  Lab 09/04/2019 1154 09/17/2019 1154 09/09/2019 1635 09/17/19 0534 09/18/19 0415 09/19/19 0530 10-17-2019 0700  PROCALCITON 0.41  --   --  0.33 0.31  --   --   WBC 6.7   < >  --  7.9 13.7* 21.1* 20.6*  LATICACIDVEN 2.8*  --  1.2  --   --   --   --    < > =  values in this interval not displayed.    Procedures/Operations    Irena Gaydos 25-Sep-2019, 4:19 PM

## 2019-09-23 NOTE — Progress Notes (Addendum)
Worse this morning-O2 requirements are up-more encephalopathic. Pulling out lines, leads etc Very tachypneic due to agitation-O2 sats in the low-mid 80's on 100 FIO2 heated high flow  Spoke w son (daughter at work-per RN she requests that I call son)-explained ongoing deterioration inspite of treatment-discussed options of transitioning to comfort care or adding some comfort medications to his ongoing treatment plan. Family aware that patient will likely not survive this hospitalization given his clinical trajectory.  Son wants [place] to start some comfort medications-but continue his treatment plan-he is going to talk to his sister and other family members-before deciding to proceed with full comfort measures.  I will reach out to them in a few hours.  Starting prn Morphine/ativana and haldol  Full note to follow.

## 2019-09-23 NOTE — Progress Notes (Signed)
Informed by RN regarding patient's continued deterioration-he is now uncomfortable and tachypneic.  I tried calling the patient's son a few minutes earlier-went to voicemail.  I subsequently reached out to him the second time-I then was able to get through to him.  Explained rapidly deteriorating status-and that he is starting to have discomfort.  Explained that given this rapid decline-patient probably will pass away very soon-and that since he is now suffering-we will need to transition to full comfort measures.  Patient's son did ask me about a ventilator-I did explain to him-that at this point I do not think a ventilator would help-furthermore per my discussion with the patient a few days back-he did not want to be placed on a ventilator if he was in the situation (see my prior notes).  Patient's son subsequently agreed to transition to full comfort measures-starting Dilaudid infusion.  Expect inpatient death soon.   RN will call them shortly-to arrange for end-of-life visitation.

## 2019-09-23 NOTE — Progress Notes (Signed)
   Sep 29, 2019 0759  Family/Significant Other Communication  Family/Significant Other Update Called;Updated (Facetime with daughter Colletta [place] and son Lanny Hurst)

## 2019-09-23 NOTE — Progress Notes (Signed)
All patient belongings found at bedside were given to patient's daughter at the conclusion of Family Visit, today 30-Sep-2019.      2019-09-30 1245  Patient Belongings/Medications Returned  Patient belongings from bedside/safe/pharmacy returned  Yes  Valuables returned to? Lily Kocher, patient's daughter  Specify valuables returned Clothing including: Jacket, PJ bottoms, Boxer brief underwear, Croc shoes, Cloth mask. Dry underwear, depends, shorts, and tshirt in Under Armor back pack. Eye glasses, Right hearing aide with batteries in case, upper and lower dentures, cell phone, cell charger.

## 2019-09-23 NOTE — Progress Notes (Signed)
End of life family visit completed with patient's daughter Jonathan Roman) and patient's son Jonathan Roman).  Patient belongings given to family (clothing, one hearing aid, glasses, cell phone and charger).  Patient returned to room and transitioned to full comfort measures.  O2 devices removed and patient placed on 2L [place].  Patient resting comfortably on dilaudid drip at 1 mg/ hr.  Patient resting with eyes closed and is unresponsive at this time.

## 2019-09-23 NOTE — Progress Notes (Signed)
Patient's O2 saturations falling despite maximum efforts.  Patient on 55L HHFNC at 100% FiO2 and 15L NRB mask.  O2 saturations now varying in low 70's.  Patient's son notified.  End of life visit encouraged.  Son stated he wanted to talk to his sister and would call back.  All questions welcomed and answered.  Call back number provided.  Physician notified of patient's decline.  Patient medicated for anxiety, work of breathing and restlessness.  Accessory muscle use noted.

## 2019-09-23 NOTE — Progress Notes (Signed)
Patient pulling off O2 devices and monitoring equipment.  When this RN attempted to assist patient, he became combative.  Staff assist called.  O2 sat probe and oxygen mask reapplied.  Patient sats in low 50's, patient confused and disoriented.  Physician notified.  Restraints applied for patient and staff safety.  Orders received for 2 mg morphine for work of breathing.  Patient is now calm, however uncooperative, with sats at 82% on 55L HHFNC and 15L NRB mask.

## 2019-09-23 NOTE — Progress Notes (Signed)
   09/19/19 1130  Family/Significant Other Communication  Family/Significant Other Update Called;Updated (daughter, Colletta [place])

## 2019-09-23 NOTE — Progress Notes (Signed)
Occupational Therapy Evaluation/Discharge Patient Details Name: Jonathan Roman MRN: [id number] DOB: 05/07/1941 Today's Date: 10/15/2019    History of Present Illness Pt is an 79 y.o. male who reports recent COVID-21 dx (09/06/19), now admitted 08/31/2019 with progressive SOB, found to be hypoxic. PMH includes HTN, gout, CAD, DM, tobacco abuse, L hand squamous cell carcinoma.   Clinical Impression   Patient was independent prior to admission.  He was on 100% 55L heated high flow during session with O2 remaining around 90. Patient required mod/max assist x2 with sit to stand and mod with bed mobility. Sat edge of bed for a few minutes and was very tired.  He required many verbal/tactine cues to follow single step directions and was disoriented.  Patient transitioned to comfort care today per chart.  OT will sign off.    Follow Up Recommendations  (transfer to comfort care)    Equipment Recommendations       Recommendations for Other Services       Precautions / Restrictions Precautions Precautions: Fall;Other (comment) Precaution Comments: HHFNC, BUE restraints Restrictions Weight Bearing Restrictions: No      Mobility Bed Mobility Overal bed mobility: Needs Assistance Bed Mobility: Supine to Sit;Sit to Supine     Supine to sit: Min assist;+2 for safety/equipment;HOB elevated Sit to supine: Mod assist;+2 for safety/equipment      Transfers Overall transfer level: Needs assistance Equipment used: 2 person hand held assist Transfers: Sit to/from Stand Sit to Stand: Mod assist;Max assist;+2 physical assistance         General transfer comment: Initial maxA+2 and bilat HHA to achieve upright standing, pt performed additional trial with modA+2; initiating movement well with verbal cues    Balance Overall balance assessment: Needs assistance   Sitting balance-Leahy Scale: Fair Sitting balance - Comments: Prolonged sitting EOB with supervision     Standing balance-Leahy  Scale: Poor                             ADL either performed or assessed with clinical judgement   ADL Overall ADL's : Needs assistance/impaired     Grooming: Maximal assistance;Bed level   Upper Body Bathing: Maximal assistance;Sitting   Lower Body Bathing: Total assistance   Upper Body Dressing : Maximal assistance   Lower Body Dressing: Total assistance   Toilet Transfer: Maximal assistance;Stand-pivot;+2 for physical assistance                   Vision         Perception     Praxis      Pertinent Vitals/Pain Pain Assessment: No/denies pain     Hand Dominance     Extremity/Trunk Assessment Upper Extremity Assessment Upper Extremity Assessment: Generalized weakness           Communication Communication Communication: HOH   Cognition Arousal/Alertness: Awake/alert Behavior During Therapy: Restless Overall Cognitive Status: Impaired/Different from baseline Area of Impairment: Orientation;Attention;Memory;Following commands;Safety/judgement;Awareness;Problem solving                 Orientation Level: Disoriented to;Place;Time;Situation Current Attention Level: Focused;Sustained Memory: Decreased short-term memory Following Commands: Follows one step commands inconsistently;Follows one step commands with increased time Safety/Judgement: Decreased awareness of deficits;Decreased awareness of safety Awareness: Intellectual Problem Solving: Requires tactile cues;Requires verbal cues     General Comments  RN present to assist throughout session. Discussed plan for pt to transition to comfort care potentially today    Exercises  Shoulder Instructions      Home Living Family/patient expects to be discharged to:: Private residence Living Arrangements: Children Available Help at Discharge: Family;Available 24 hours/day Type of Home: House Home Access: Stairs to enter [employer] of Steps: 4   Home Layout: One  level     Bathroom Shower/Tub: Walk-in shower         Home Equipment: Grab bars - tub/shower;Walker - 2 wheels;Cane - single point;Shower seat   Additional Comments: Lives with son, daughter and dogs. Wife passed away 4 yrs ago      Prior Functioning/Environment Level of Independence: Independent        Comments: Intermittent use of SPC "if feeling sick," otherwise independent without DME. Reports primarily sedentary, only walks household distances. Children do shopping and household tasks. Wears glasses. CPAP at night. 1x fall in past 6 months slipping on slick wood flooring without shoes on. Pt independent with ADLs with family completing IADL. Pt has a license but doesn't drive.        OT Problem List: Decreased strength;Decreased activity tolerance;Impaired balance (sitting and/or standing);Cardiopulmonary status limiting activity      OT Treatment/Interventions:      OT Goals(Current goals can be found in the care plan section) Acute Rehab OT Goals Patient Stated Goal: to go home OT Goal Formulation: Patient unable to participate in goal setting Time For Goal Achievement: 10/01/19 Potential to Achieve Goals: Fair  OT Frequency:    Barriers to D/C:            Co-evaluation PT/OT/SLP Co-Evaluation/Treatment: Yes Reason for Co-Treatment: For patient/therapist safety;To address functional/ADL transfers   OT goals addressed during session: ADL's and self-care      AM-PAC OT "6 Clicks" Daily Activity     Outcome Measure Help from another person eating meals?: None Help from another person taking care of personal grooming?: A Lot Help from another person toileting, which includes using toliet, bedpan, or urinal?: Total Help from another person bathing (including washing, rinsing, drying)?: Total Help from another person to put on and taking off regular upper body clothing?: A Lot Help from another person to put on and taking off regular lower body clothing?: A  Lot 6 Click Score: 12   End of Session Equipment Utilized During Treatment: Oxygen Nurse Communication: Mobility status  Activity Tolerance: Patient limited by fatigue Patient left: in bed;with nursing/sitter in room;with call bell/phone within reach  OT Visit Diagnosis: Unsteadiness on feet (R26.81);Muscle weakness (generalized) (M62.81)                Time: [id number] OT Time Calculation (min): 11 min Charges:  OT General Charges $OT Visit: 1 Visit OT Evaluation $OT Eval Moderate Complexity: 1 [address] OTR/L   Phylliss Bob 10-14-19, 2:17 PM

## 2019-09-23 NOTE — Progress Notes (Signed)
End of life family visit scheduled for 12:00 pm today.  AC and charge RN notified.

## 2019-09-23 NOTE — Progress Notes (Signed)
90 ml dilaudid drip wasted with Daun Peacock, RN at this time.

## 2019-09-23 NOTE — Progress Notes (Signed)
Physical Therapy Treatment Patient Details Name: Jonathan Roman MRN: [id number] DOB: 04-30-1941 Today's Date: 09/26/2019    History of Present Illness Pt is an 79 y.o. male who reports recent COVID-1 dx (09/06/19), now admitted 09/10/2019 with progressive SOB, found to be hypoxic. PMH includes HTN, gout, CAD, DM, tobacco abuse, L hand squamous cell carcinoma.   PT Comments    Pt with increased O2 needs and AMS since initial PT evaluation. Able to tolerate sitting EOB, 2x standing trials and take side steps with mod-maxA+2 and bilat HHA. 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 Confused, but able to be directed and following majority of simple commands. SpO2 down to 76% on 55L HFNC 100% FiO2 with activity, returning to >/84% with prolonged seated rest. Per chart, potential plan for transition to comfort care today. Acute PT will sign off. Please reorder if new needs arise.   Follow Up Recommendations  Other (potential transition to comfort care)     Equipment Recommendations  (TBD)    Recommendations for Other Services       Precautions / Restrictions Precautions Precautions: Fall;Other (comment) Precaution Comments: HHFNC, BUE restraints Restrictions Weight Bearing Restrictions: No    Mobility  Bed Mobility Overal bed mobility: Needs Assistance Bed Mobility: Supine to Sit;Sit to Supine     Supine to sit: Min assist;+2 for safety/equipment;HOB elevated Sit to supine: Mod assist;+2 for safety/equipment      Transfers Overall transfer level: Needs assistance Equipment used: 2 person hand held assist Transfers: Sit to/from Stand Sit to Stand: Mod assist;Max assist;+2 physical assistance         General transfer comment: Initial maxA+2 and bilat HHA to achieve upright standing, pt performed additional trial with modA+2; initiating movement well with verbal cues  Ambulation/Gait Ambulation/Gait assistance: Mod assist;+2 physical  assistance Gait Distance (Feet): 2 Feet Assistive device: 2 person hand held assist Gait Pattern/deviations: Step-to pattern;Trunk flexed;Decreased weight shift to left   Gait velocity interpretation: <1.31 ft/sec, indicative of household ambulator General Gait Details: Unsteady side steps towards HOB with mod-maxA+2 and bilat HHA to maintain balance, pt with difficulty accepting full weight onto LLE in order to take complete step with RLE. 2x trials of side steps with seated rest break in between; SpO2 down to 76% on 55L HHFNC (100% FiO2)   Stairs             Wheelchair Mobility    Modified Rankin (Stroke Patients Only)       Balance Overall balance assessment: Needs assistance   Sitting balance-Leahy Scale: Fair Sitting balance - Comments: Prolonged sitting EOB with supervision     Standing balance-Leahy Scale: Poor                              Cognition Arousal/Alertness: Awake/alert Behavior During Therapy: Restless Overall Cognitive Status: Impaired/Different from baseline Area of Impairment: Orientation;Attention;Memory;Following commands;Safety/judgement;Awareness;Problem solving                 Orientation Level: Disoriented to;Place;Time;Situation Current Attention Level: Focused;Sustained Memory: Decreased short-term memory Following Commands: Follows one step commands inconsistently;Follows one step commands with increased time Safety/Judgement: Decreased awareness of deficits;Decreased awareness of safety Awareness: Intellectual Problem Solving: Requires verbal cues        Exercises      General Comments General comments (skin integrity, edema, etc.): RN present to assist throughout session. Discussed plan for pt to transition to  comfort care potentially today      Pertinent Vitals/Pain Pain Assessment: No/denies pain    Home Living                      Prior Function            PT Goals (current goals can now  be found in the care plan section) Progress towards PT goals: Not progressing toward goals - comment    Frequency    Min 2X/week      PT Plan Discharge plan needs to be updated;Frequency needs to be updated    Co-evaluation              AM-PAC PT "6 Clicks" Mobility   Outcome Measure  Help needed turning from your back to your side while in a flat bed without using bedrails?: A Little Help needed moving from lying on your back to sitting on the side of a flat bed without using bedrails?: A Little Help needed moving to and from a bed to a chair (including a wheelchair)?: A Lot Help needed standing up from a chair using your arms (e.g., wheelchair or bedside chair)?: A Lot Help needed to walk in hospital room?: A Lot Help needed climbing 3-5 steps with a railing? : Total 6 Click Score: 13    End of Session Equipment Utilized During Treatment: Oxygen Activity Tolerance: Patient tolerated treatment well;Treatment limited secondary to medical complications (Comment) Patient left: in bed;with call bell/phone within reach;with bed alarm set;with nursing/sitter in room Nurse Communication: Mobility status PT Visit Diagnosis: Other abnormalities of gait and mobility (R26.89);Muscle weakness (generalized) (M62.81)     Time: [id number] PT Time Calculation (min) (ACUTE ONLY): 23 min  Charges:  $Therapeutic Activity: 8-22 mins                    Mabeline Caras, PT, DPT Acute Rehabilitation Services  Pager [phone] Office [phone]  Derry Lory September 29, 2019, 11:29 AM

## 2019-09-23 DEATH — deceased

## 2020-09-26 IMAGING — DX DG CHEST 1V PORT SAME DAY
1 series · 1 of 1 positions shown · non-contrast
Comparison: 09/16/2019

CLINICAL DATA: Hypoxia and respiratory failure secondary to COVID
pneumonia.

EXAM:
PORTABLE CHEST 1 VIEW

[chest]
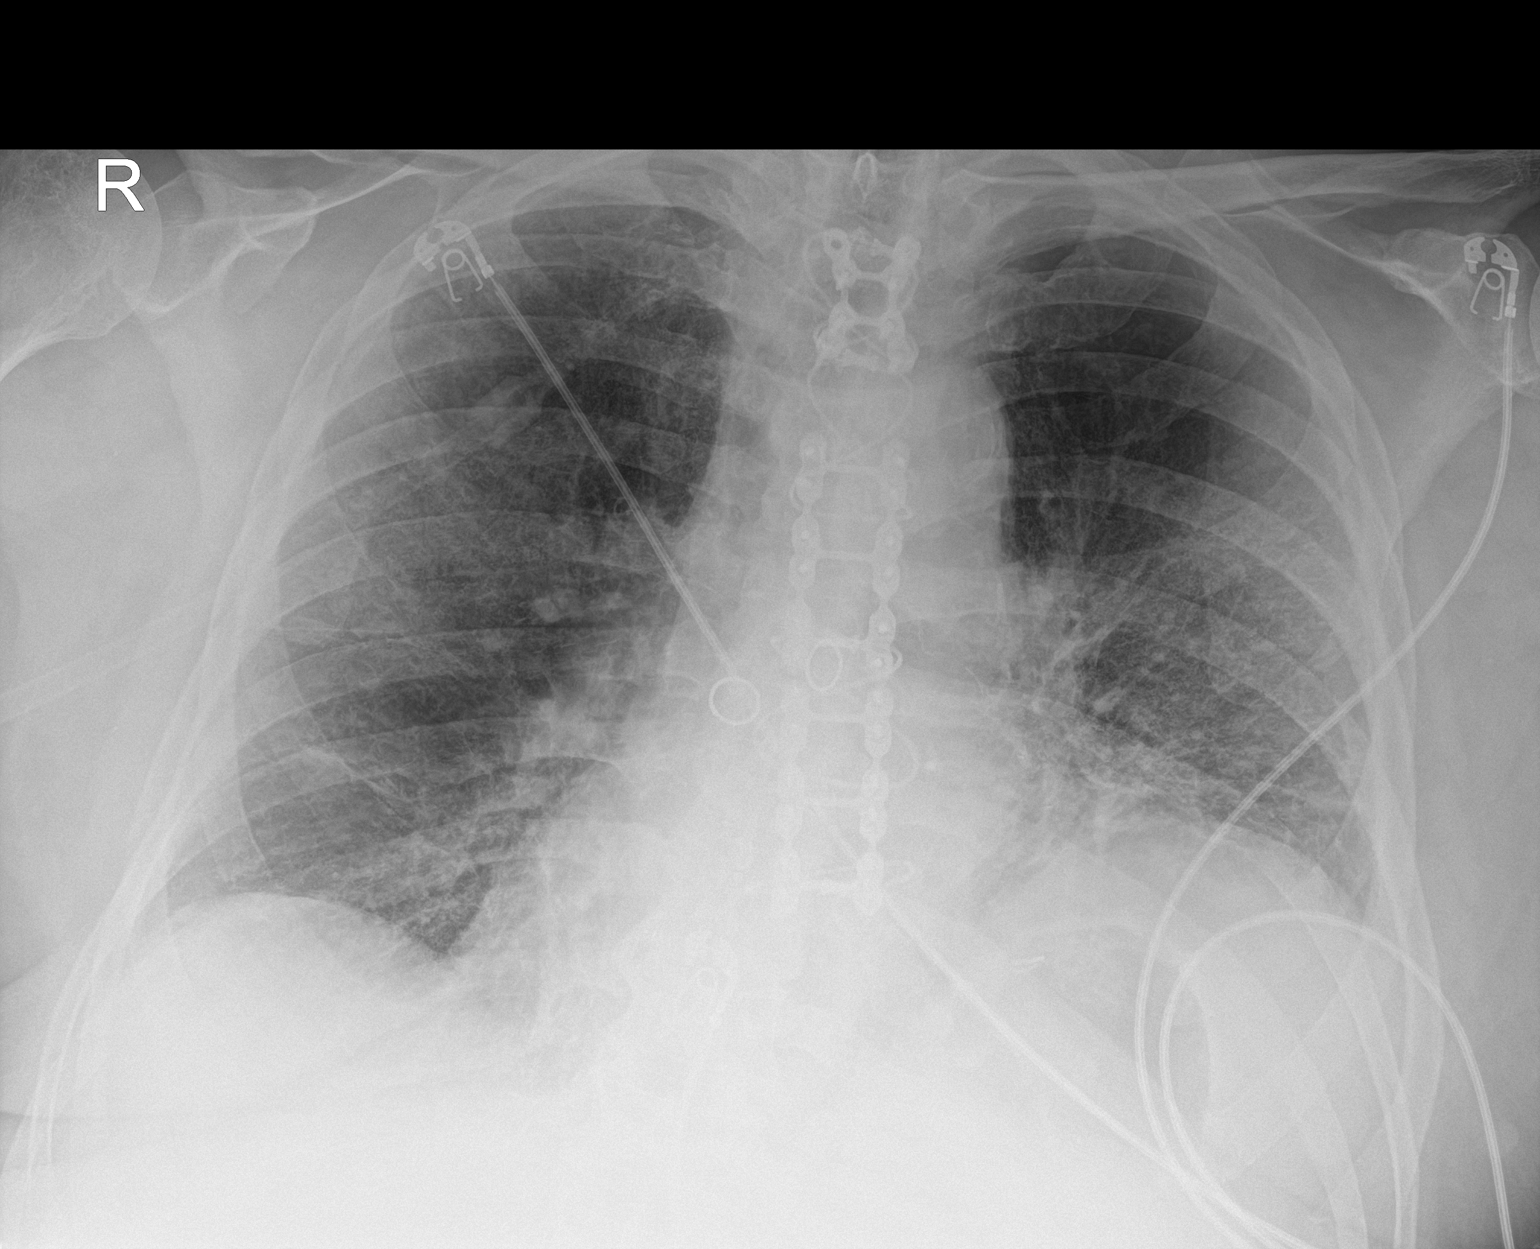

[1 of 1 positions shown; findings below may reference images not displayed]

FINDINGS: Stable heart size. Interstitial infiltrates predominantly in the
lower lung zones bilaterally show some interval worsening since the
prior chest x-ray. No overt airspace edema, pleural effusions or
pneumothorax identified.
IMPRESSION: Some interval worsening of bilateral pulmonary interstitial
infiltrates.

## 2020-09-27 IMAGING — DX DG CHEST 1V PORT SAME DAY
1 series · 1 of 1 positions shown · non-contrast
Comparison: Single-view of the chest 09/17/2019 and 09/16/2019.

CLINICAL DATA: Shortness of breath. Respiratory failure due to
COVID 19.

EXAM:
PORTABLE CHEST 1 VIEW

[chest]
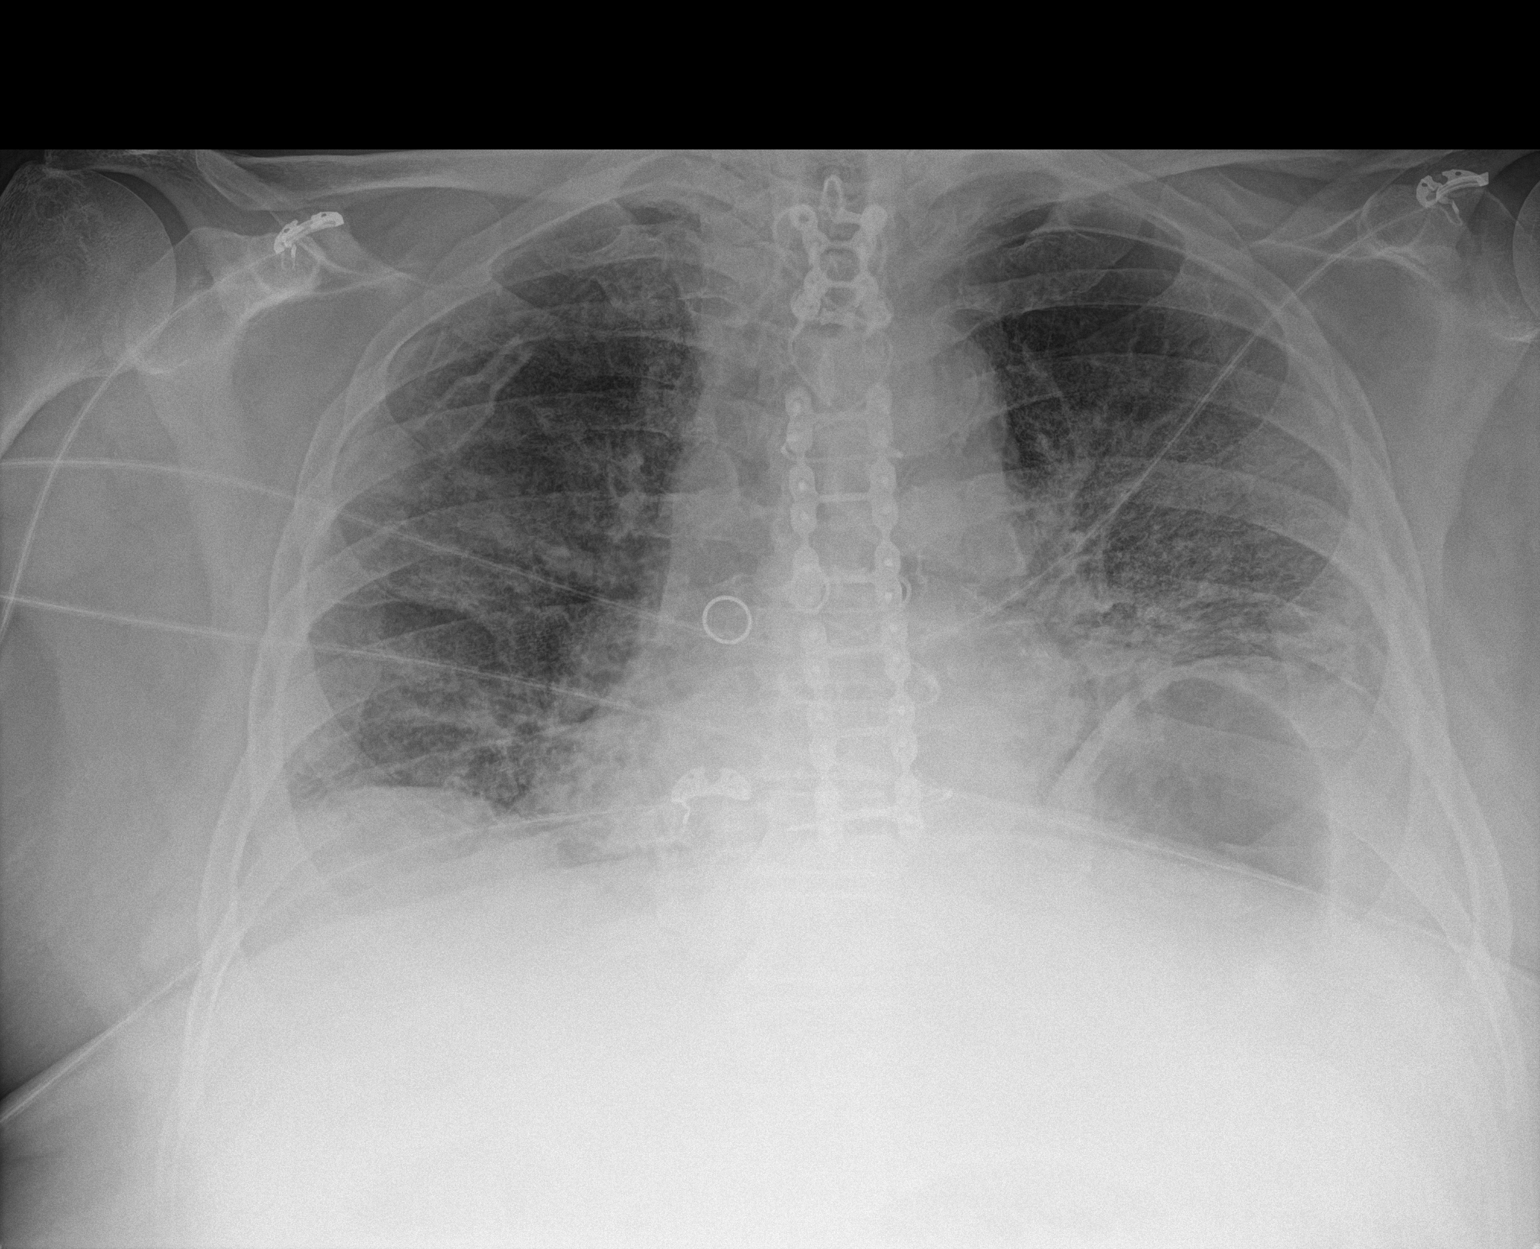

[1 of 1 positions shown; findings below may reference images not displayed]

FINDINGS: Hazy bilateral airspace opacities persist without change. No
pneumothorax or pleural effusion. Heart size is normal. No acute
bony abnormality.
IMPRESSION: No change in bilateral airspace disease consistent with pneumonia.
